# Patient Record
Sex: Female | Born: 1955 | Race: Black or African American | Hispanic: No | Marital: Married | Smoking: Never smoker
Health system: Southern US, Community
[De-identification: ages and names within clinical notes are randomized; demographics above are authoritative.]

## PROBLEM LIST (undated history)

## (undated) HISTORY — PX: BREAST REDUCTION SURGERY: SHX8

---

## 1999-04-15 ENCOUNTER — Emergency Department (HOSPITAL_COMMUNITY): Admission: EM | Admit: 1999-04-15 | Discharge: 1999-04-15 | Payer: Self-pay

## 1999-04-24 ENCOUNTER — Other Ambulatory Visit: Admission: RE | Admit: 1999-04-24 | Discharge: 1999-04-24 | Payer: Self-pay | Admitting: Obstetrics and Gynecology

## 2001-02-06 ENCOUNTER — Other Ambulatory Visit: Admission: RE | Admit: 2001-02-06 | Discharge: 2001-02-06 | Payer: Self-pay | Admitting: Obstetrics and Gynecology

## 2002-07-10 ENCOUNTER — Encounter: Admission: RE | Admit: 2002-07-10 | Discharge: 2002-07-10 | Payer: Self-pay | Admitting: Obstetrics and Gynecology

## 2002-07-10 ENCOUNTER — Encounter: Payer: Self-pay | Admitting: Obstetrics and Gynecology

## 2004-12-14 ENCOUNTER — Encounter: Admission: RE | Admit: 2004-12-14 | Discharge: 2004-12-14 | Payer: Self-pay | Admitting: Obstetrics and Gynecology

## 2004-12-25 ENCOUNTER — Encounter (INDEPENDENT_AMBULATORY_CARE_PROVIDER_SITE_OTHER): Payer: Self-pay | Admitting: *Deleted

## 2004-12-25 ENCOUNTER — Encounter: Admission: RE | Admit: 2004-12-25 | Discharge: 2004-12-25 | Payer: Self-pay | Admitting: Obstetrics and Gynecology

## 2005-03-01 ENCOUNTER — Ambulatory Visit: Payer: Self-pay | Admitting: Internal Medicine

## 2006-02-07 ENCOUNTER — Ambulatory Visit: Payer: Self-pay | Admitting: Internal Medicine

## 2007-01-09 DIAGNOSIS — J309 Allergic rhinitis, unspecified: Secondary | ICD-10-CM | POA: Insufficient documentation

## 2007-02-25 ENCOUNTER — Ambulatory Visit: Payer: Self-pay | Admitting: Internal Medicine

## 2007-02-25 DIAGNOSIS — R42 Dizziness and giddiness: Secondary | ICD-10-CM | POA: Insufficient documentation

## 2007-04-29 ENCOUNTER — Telehealth: Payer: Self-pay | Admitting: Internal Medicine

## 2010-05-15 ENCOUNTER — Ambulatory Visit
Admission: RE | Admit: 2010-05-15 | Discharge: 2010-05-15 | Payer: Self-pay | Source: Home / Self Care | Attending: Internal Medicine | Admitting: Internal Medicine

## 2010-05-15 ENCOUNTER — Other Ambulatory Visit: Payer: Self-pay | Admitting: Internal Medicine

## 2010-05-15 LAB — HEPATIC FUNCTION PANEL
ALT: 13 U/L (ref 0–35)
AST: 16 U/L (ref 0–37)
Albumin: 3.9 g/dL (ref 3.5–5.2)
Alkaline Phosphatase: 60 U/L (ref 39–117)
Bilirubin, Direct: 0.1 mg/dL (ref 0.0–0.3)
Total Bilirubin: 0.5 mg/dL (ref 0.3–1.2)
Total Protein: 7 g/dL (ref 6.0–8.3)

## 2010-05-15 LAB — CBC WITH DIFFERENTIAL/PLATELET
Basophils Absolute: 0 10*3/uL (ref 0.0–0.1)
Basophils Relative: 0.7 % (ref 0.0–3.0)
Eosinophils Absolute: 0.1 10*3/uL (ref 0.0–0.7)
Eosinophils Relative: 1.9 % (ref 0.0–5.0)
HCT: 35.4 % — ABNORMAL LOW (ref 36.0–46.0)
Hemoglobin: 11.2 g/dL — ABNORMAL LOW (ref 12.0–15.0)
Lymphocytes Relative: 39.8 % (ref 12.0–46.0)
Lymphs Abs: 2.4 10*3/uL (ref 0.7–4.0)
MCHC: 31.7 g/dL (ref 30.0–36.0)
MCV: 72.9 fl — ABNORMAL LOW (ref 78.0–100.0)
Monocytes Absolute: 0.3 10*3/uL (ref 0.1–1.0)
Monocytes Relative: 5.3 % (ref 3.0–12.0)
Neutro Abs: 3.2 10*3/uL (ref 1.4–7.7)
Neutrophils Relative %: 52.3 % (ref 43.0–77.0)
Platelets: 251 10*3/uL (ref 150.0–400.0)
RBC: 4.86 Mil/uL (ref 3.87–5.11)
RDW: 21.2 % — ABNORMAL HIGH (ref 11.5–14.6)
WBC: 6.1 10*3/uL (ref 4.5–10.5)

## 2010-05-15 LAB — BASIC METABOLIC PANEL
BUN: 15 mg/dL (ref 6–23)
CO2: 31 mEq/L (ref 19–32)
Calcium: 9.4 mg/dL (ref 8.4–10.5)
Chloride: 107 mEq/L (ref 96–112)
Creatinine, Ser: 0.7 mg/dL (ref 0.4–1.2)
GFR: 109.42 mL/min (ref >60.00)
Glucose, Bld: 78 mg/dL (ref 70–99)
Potassium: 4 mEq/L (ref 3.5–5.1)
Sodium: 143 mEq/L (ref 135–145)

## 2010-05-15 LAB — LIPID PANEL
Cholesterol: 160 mg/dL (ref 0–200)
HDL: 38.3 mg/dL — ABNORMAL LOW (ref >39.00)
LDL Cholesterol: 101 mg/dL — ABNORMAL HIGH (ref 0–99)
Total CHOL/HDL Ratio: 4
Triglycerides: 102 mg/dL (ref 0.0–149.0)
VLDL: 20.4 mg/dL (ref 0.0–40.0)

## 2010-05-15 LAB — TSH: TSH: 1.49 u[IU]/mL (ref 0.35–5.50)

## 2010-05-16 ENCOUNTER — Encounter: Payer: Self-pay | Admitting: Internal Medicine

## 2010-06-08 NOTE — Assessment & Plan Note (Signed)
Summary: new pt ov//ccm   Vital Signs:  Patient profile:   55 year old female Height:      63.5 inches Weight:      199 pounds BMI:     34.82 O2 Sat:      97 % on Room air Temp:     97.9 degrees F oral Pulse rate:   64 / minute BP sitting:   120 / 76  (right arm) Cuff size:   regular  Vitals Entered By: Duard Brady LPN (May 15, 2010 1:59 PM)  O2 Flow:  Room air ChiefCC: re- establish - doing well Is Patient Diabetic? No   CC:  re- establish - doing well.  History of Present Illness: 55 -year-old patient who is in today to establish with our practice.  she has enjoyed excellent health and is followed by gynecology.  She states she has a long history of anemia since childhood.  She also has a history of childhood asthma that is no longer an issue.  She is followed by gynecology and does have a history of uterine fibroids.  Her last menstrual cycle was approximately 5 months ago  Preventive Screening-Counseling & Management  Alcohol-Tobacco     Smoking Status: never  Allergies: 1)  ! Penicillin V Potassium (Penicillin V Potassium)  Past History:  Past Medical History: Allergic rhinitis history of anemia uterine fibroids  Past Surgical History: status post breast reduction surgery in 1994 no prior colonoscopy  Family History: Reviewed history and no changes required. 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    Physical Exam  General:  overweight-appearing.  124/80overweight-appearing.   Head:  Normocephalic and atraumatic without obvious abnormalities. No apparent alopecia or balding. Eyes:  No corneal or conjunctival inflammation noted. EOMI. Perrla. Funduscopic exam benign, without hemorrhages, exudates or papilledema. Vision grossly normal. Ears:  External ear exam shows no significant lesions or deformities.  Otoscopic examination reveals clear canals, tympanic membranes are intact bilaterally without bulging, retraction, inflammation or discharge. Hearing is grossly normal bilaterally. Nose:  External nasal examination shows no deformity or inflammation. Nasal mucosa are pink and moist without lesions or exudates. Mouth:  Oral mucosa and oropharynx without lesions or exudates.  Teeth in good repair. Neck:  No deformities, masses, or tenderness noted. Chest Wall:  No deformities, masses, or tenderness noted. Lungs:  Normal respiratory effort, chest expands symmetrically. Lungs are clear to auscultation, no crackles or wheezes. Heart:  Normal rate and regular rhythm. S1 and S2 normal without gallop, murmur, click, rub or other extra sounds. Abdomen:  Bowel sounds positive,abdomen soft and non-tender, no organomegaly or hernias noted. pelvic mass noted, more prominent on the right side Msk:  No deformity or scoliosis noted of thoracic or lumbar spine.   Pulses:  R and L carotid,radial,femoral,dorsalis pedis and posterior tibial pulses are full and equal bilaterally Extremities:  No clubbing, cyanosis, edema, or deformity noted with normal full range of motion of all joints.  Neurologic:  No cranial nerve deficits noted. Station and gait are normal. Plantar reflexes are down-going bilaterally. DTRs are symmetrical throughout. Sensory, motor and  coordinative functions appear intact. Skin:  Intact without suspicious lesions or rashes Cervical Nodes:  No lymphadenopathy noted Axillary Nodes:  No palpable lymphadenopathy Inguinal Nodes:  No significant adenopathy Psych:  Cognition and judgment appear intact. Alert and cooperative with normal attention span and concentration. No apparent delusions, illusions, hallucinations   Impression & Recommendations:  Problem # 1:  Preventive Health Care (ICD-V70.0)  Orders: Venipuncture (16109) TLB-Lipid Panel (80061-LIPID) TLB-BMP (Basic Metabolic Panel-BMET) (80048-METABOL) TLB-CBC Platelet - w/Differential (85025-CBCD) TLB-Hepatic/Liver Function Pnl (80076-HEPATIC) TLB-TSH (Thyroid Stimulating Hormone) (60454-UJW) Gastroenterology Referral (GI) Specimen Handling (11914)  Complete Medication List: 1)  Claritin 10 Mg Caps (Loratadine) .... Prn  Patient Instructions: 1)  Please schedule a follow-up appointment in 1 year. 2)  Limit your Sodium (Salt). 3)  It is important that you exercise regularly at least 20 minutes 5 times a week. If you develop chest pain, have severe difficulty breathing, or feel very tired , stop exercising immediately and seek medical attention. 4)  You need to lose weight. Consider a lower calorie diet and regular exercise.  5)  Schedule a colonoscopy/sigmoidoscopy to help detect colon cancer. 6)  Take calcium +Vitamin D daily. Who  Orders Added: 1)  New Patient 57-64 years [99386] 2)  Venipuncture [36415] 3)  TLB-Lipid Panel [80061-LIPID] 4)  TLB-BMP (Basic Metabolic Panel-BMET) [80048-METABOL] 5)  TLB-CBC Platelet - w/Differential [85025-CBCD] 6)  TLB-Hepatic/Liver Function Pnl [80076-HEPATIC] 7)  TLB-TSH (Thyroid Stimulating Hormone) [84443-TSH] 8)  Gastroenterology Referral [GI] 9)  Specimen Handling [99000]

## 2010-06-08 NOTE — Letter (Signed)
Summary: PATIENT HX FORM  PATIENT HX FORM   Imported By: Georgian Co 05/16/2010 09:10:38  _____________________________________________________________________  External Attachment:    Type:   Image     Comment:   External Document

## 2013-11-04 ENCOUNTER — Encounter (HOSPITAL_COMMUNITY): Payer: Self-pay | Admitting: Emergency Medicine

## 2013-11-04 ENCOUNTER — Emergency Department (HOSPITAL_COMMUNITY)
Admission: EM | Admit: 2013-11-04 | Discharge: 2013-11-04 | Disposition: A | Discharge status: Home / Self Care | Payer: BC Managed Care – PPO | Attending: Emergency Medicine | Admitting: Emergency Medicine

## 2013-11-04 DIAGNOSIS — M545 Low back pain, unspecified: Secondary | ICD-10-CM | POA: Insufficient documentation

## 2013-11-04 DIAGNOSIS — Z880 Allergy status to penicillin: Secondary | ICD-10-CM | POA: Insufficient documentation

## 2013-11-04 MED ORDER — OXYCODONE HCL 5 MG PO TABS: 2.5000 mg | ORAL_TABLET | ORAL | Status: DC | PRN

## 2013-11-04 MED ORDER — OXYCODONE HCL 5 MG PO TABS
5.0000 mg | ORAL_TABLET | Freq: Once | ORAL | Status: AC
  Administered 2013-11-04: 5 mg via ORAL
  Filled 2013-11-04: qty 1

## 2013-11-04 NOTE — ED Provider Notes (Signed)
CSN: [id number]     Arrival date & time 11/04/13  1221 History  This chart was scribed for non-physician practitioner, Mellody DrownLauren Josilyn Shippee, PA-C working with Flint MelterElliott L Wentz, MD by Greggory StallionKayla Andersen, ED scribe. This patient was seen in room TR10C/TR10C and the patient's care was started at 1:59 PM.   Chief Complaint  Patient presents with  . Back Pain   HPI Comments: Kristin HoylesKaren Bonita Richardson is a 58 y.o. female who presents Kristin the Emergency Department complaining of intermittent lower back pain that radiates into her left hip that started 3 weeks ago. States the most recent episode started one week ago and states it is worse than before. Movement worsens the pain. Denies radiation. Pt has not taken anything for her pain. No lower extremity discomfort. Denies fever, chills, bowel or bladder incontinence, dysuria, hematuria, urinary frequency, urgency, numbness or tingling in lower extremities. Denies history of diabetes.  PCP: Rogelia BogaKWIATKOWSKI,PETER FRANK, MD  The history is provided by the patient. No language interpreter was used.    History reviewed. No pertinent past medical history. History reviewed. No pertinent past surgical history. History reviewed. No pertinent family history. History  Substance Use Topics  . Smoking status: Never Smoker   . Smokeless tobacco: Not on file  . Alcohol Use: No   OB History   Grav Para Term Preterm Abortions TAB SAB Ect Mult Living                 Review of Systems  Constitutional: Negative for fever and chills.  Gastrointestinal: Negative for abdominal pain.  Genitourinary: Negative for dysuria, urgency, frequency and hematuria.       Negative for bowel or bladder incontinence.  Musculoskeletal: Positive for back pain.  Skin: Negative for color change and wound.  Neurological: Negative for weakness and numbness.  All other systems reviewed and are negative.  Allergies  Aspirin; Ibuprofen; Penicillins; and Tylenol  Home Medications   Prior Kristin Admission  medications   Not on File   BP 145/75  Pulse 82  Temp(Src) 98.3 F (36.8 C) (Oral)  Resp 20  Ht 5\' 3"  (1.6 m)  Wt 190 lb (86.183 kg)  BMI 33.67 kg/m2  SpO2 99%  Physical Exam  Nursing note and vitals reviewed. Constitutional: She is oriented Kristin person, place, and time. She appears well-developed and well-nourished.  Non-toxic appearance. She does not have a sickly appearance. She does not appear ill. No distress.  HENT:  Head: Normocephalic and atraumatic.  Eyes: Conjunctivae and EOM are normal.  Neck: Neck supple.  Cardiovascular: Normal rate.   Pulmonary/Chest: Effort normal. No respiratory distress.  Musculoskeletal: Normal range of motion.  No midline C-spine, T-spine, or L-spine tenderness with no step-offs, crepitus, or deformities noted. Comfort reproducible with palpation of left SI joint. Good and equal strength, and sensation Kristin the lower extremities.  Neurological: She is alert and oriented Kristin person, place, and time.  Strength and sensation intact in bilateral lower extremities.  Skin: Skin is warm and dry. She is not diaphoretic.  Psychiatric: She has a normal mood and affect. Her behavior is normal.    ED Course  Procedures (including critical care time)  COORDINATION OF CARE: 2:07 PM-Discussed treatment plan which includes pain medication with pt at bedside and pt agreed Kristin plan.   Labs Review Labs Reviewed - No data Kristin display  Imaging Review No results found.   EKG Interpretation None      MDM   Final diagnoses:  Left-sided low back pain  without sciatica   Patient presents with left lower back pain, no neurologic findings on exam, out tenderness Kristin left SI joint. Patient reports an allergy Kristin multiple medications, will treat with oxycodone without Tylenol. No concern for cauda equina.  No fever, night sweats, weight loss, h/o cancer, IVDU.  RICE protocol and pain medicine indicated and discussed with patient. Discussed treatment plan with the  patient. Return precautions given. Reports understanding and no other concerns at this time.  Patient is stable for discharge at this time.  Meds given in ED:  Medications  oxyCODONE (Oxy IR/ROXICODONE) immediate release tablet 5 mg (not administered)    New Prescriptions   OXYCODONE (ROXICODONE) 5 MG IMMEDIATE RELEASE TABLET    Take 0.5 tablets (2.5 mg total) by mouth every 4 (four) hours as needed for severe pain.    I personally performed the services described in this documentation, which was scribed in my presence. The recorded information has been reviewed and is accurate.  Clabe SealLauren M Ellarose Brandi, PA-C 11/04/13 [phone]

## 2013-11-04 NOTE — Discharge Instructions (Signed)
Call for a follow up appointment with a Family or Primary Care Provider.  Return if Symptoms worsen.   Take medication as prescribed.  Ice your back 3-4 times a day. Use your pain medication as prescribed and do not operate heavy machinery while on pain medication. Note that your pain medication contains acetaminophen (Tylenol) & its is not recommended that you use additional acetaminophen (Tylenol) while taking this medication.

## 2013-11-04 NOTE — ED Notes (Signed)
Pt c/o left sided lower back into hip pain x 3 weeks worse over last week; pt denies obvious injury

## 2013-11-04 NOTE — ED Provider Notes (Signed)
Medical screening examination/treatment/procedure(s) were performed by non-physician practitioner and as supervising physician I was immediately available for consultation/collaboration.  Jamiria Langill L Josemanuel Eakins, MD 11/04/13 1557 

## 2014-12-24 ENCOUNTER — Encounter (HOSPITAL_COMMUNITY): Payer: Self-pay | Admitting: *Deleted

## 2014-12-24 ENCOUNTER — Emergency Department (HOSPITAL_COMMUNITY)
Admission: EM | Admit: 2014-12-24 | Discharge: 2014-12-24 | Disposition: A | Discharge status: Home / Self Care | Payer: BLUE CROSS/BLUE SHIELD | Attending: Emergency Medicine | Admitting: Emergency Medicine

## 2014-12-24 ENCOUNTER — Emergency Department (HOSPITAL_COMMUNITY): Payer: BLUE CROSS/BLUE SHIELD

## 2014-12-24 DIAGNOSIS — M545 Low back pain, unspecified: Secondary | ICD-10-CM

## 2014-12-24 DIAGNOSIS — S3992XA Unspecified injury of lower back, initial encounter: Secondary | ICD-10-CM | POA: Diagnosis not present

## 2014-12-24 DIAGNOSIS — Y9301 Activity, walking, marching and hiking: Secondary | ICD-10-CM | POA: Insufficient documentation

## 2014-12-24 DIAGNOSIS — W19XXXA Unspecified fall, initial encounter: Secondary | ICD-10-CM

## 2014-12-24 DIAGNOSIS — S8991XA Unspecified injury of right lower leg, initial encounter: Secondary | ICD-10-CM | POA: Diagnosis present

## 2014-12-24 DIAGNOSIS — Y998 Other external cause status: Secondary | ICD-10-CM | POA: Insufficient documentation

## 2014-12-24 DIAGNOSIS — Y92511 Restaurant or cafe as the place of occurrence of the external cause: Secondary | ICD-10-CM | POA: Diagnosis not present

## 2014-12-24 DIAGNOSIS — Z880 Allergy status to penicillin: Secondary | ICD-10-CM | POA: Diagnosis not present

## 2014-12-24 DIAGNOSIS — S8011XA Contusion of right lower leg, initial encounter: Secondary | ICD-10-CM | POA: Insufficient documentation

## 2014-12-24 DIAGNOSIS — W108XXA Fall (on) (from) other stairs and steps, initial encounter: Secondary | ICD-10-CM | POA: Insufficient documentation

## 2014-12-24 DIAGNOSIS — M25561 Pain in right knee: Secondary | ICD-10-CM

## 2014-12-24 MED ORDER — OXYCODONE HCL 5 MG PO TABS: 2.5000 mg | ORAL_TABLET | ORAL | Status: DC | PRN

## 2014-12-24 MED ORDER — OXYCODONE HCL 5 MG PO TABS
2.5000 mg | ORAL_TABLET | Freq: Once | ORAL | Status: AC
  Administered 2014-12-24: 2.5 mg via ORAL

## 2014-12-24 MED ORDER — OXYCODONE HCL 5 MG PO TABS
5.0000 mg | ORAL_TABLET | Freq: Once | ORAL | Status: DC
  Filled 2014-12-24: qty 1

## 2014-12-24 MED ORDER — METHOCARBAMOL 500 MG PO TABS: 500.0000 mg | ORAL_TABLET | Freq: Two times a day (BID) | ORAL | Status: DC | PRN

## 2014-12-24 MED ORDER — CYCLOBENZAPRINE HCL 10 MG PO TABS
10.0000 mg | ORAL_TABLET | Freq: Once | ORAL | Status: AC
Start: 2014-12-24 — End: 2014-12-24
  Administered 2014-12-24: 10 mg via ORAL
  Filled 2014-12-24: qty 1

## 2014-12-24 NOTE — ED Notes (Signed)
Pt presents via POV c/o right knee pain and lower back pain after she fell in the bathroom at [employer] when there was grease on the floor.  Pt denies hitting her head, reports knee swelling and pain after, knee brace on when pt arrived, pt able to walk on it but with pain, no deformity noted, swelling noted in leg and ankle.  Pt a x 4, NAD.

## 2014-12-24 NOTE — Discharge Instructions (Signed)
Knee Pain °The knee is the complex joint between your thigh and your lower leg. It is made up of bones, tendons, ligaments, and cartilage. The bones that make up the knee are: °· The femur in the thigh. °· The tibia and fibula in the lower leg. °· The patella or kneecap riding in the groove on the lower femur. °CAUSES  °Knee pain is a common complaint with many causes. A few of these causes are: °· Injury, such as: °· A ruptured ligament or tendon injury. °· Torn cartilage. °· Medical conditions, such as: °· Gout °· Arthritis °· Infections °· Overuse, over training, or overdoing a physical activity. °Knee pain can be minor or severe. Knee pain can accompany debilitating injury. Minor knee problems often respond well to self-care measures or get well on their own. More serious injuries may need medical intervention or even surgery. °SYMPTOMS °The knee is complex. Symptoms of knee problems can vary widely. Some of the problems are: °· Pain with movement and weight bearing. °· Swelling and tenderness. °· Buckling of the knee. °· Inability to straighten or extend your knee. °· Your knee locks and you cannot straighten it. °· Warmth and redness with pain and fever. °· Deformity or dislocation of the kneecap. °DIAGNOSIS  °Determining what is wrong may be very straight forward such as when there is an injury. It can also be challenging because of the complexity of the knee. Tests to make a diagnosis may include: °· Your caregiver taking a history and doing a physical exam. °· Routine X-rays can be used to rule out other problems. X-rays will not reveal a cartilage tear. Some injuries of the knee can be diagnosed by: °¨ Arthroscopy a surgical technique by which a small video camera is inserted through tiny incisions on the sides of the knee. This procedure is used to examine and repair internal knee joint problems. Tiny instruments can be used during arthroscopy to repair the torn knee cartilage (meniscus). °¨ Arthrography  is a radiology technique. A contrast liquid is directly injected into the knee joint. Internal structures of the knee joint then become visible on X-ray film. °¨ An MRI scan is a non X-ray radiology procedure in which magnetic fields and a computer produce two- or three-dimensional images of the inside of the knee. Cartilage tears are often visible using an MRI scanner. MRI scans have largely replaced arthrography in diagnosing cartilage tears of the knee. °· Blood work. °· Examination of the fluid that helps to lubricate the knee joint (synovial fluid). This is done by taking a sample out using a needle and a syringe. °TREATMENT °The treatment of knee problems depends on the cause. Some of these treatments are: °· Depending on the injury, proper casting, splinting, surgery, or physical therapy care will be needed. °· Give yourself adequate recovery time. Do not overuse your joints. If you begin to get sore during workout routines, back off. Slow down or do fewer repetitions. °· For repetitive activities such as cycling or running, maintain your strength and nutrition. °· Alternate muscle groups. For example, if you are a weight lifter, work the upper body on one day and the lower body the next. °· Either tight or weak muscles do not give the proper support for your knee. Tight or weak muscles do not absorb the stress placed on the knee joint. Keep the muscles surrounding the knee strong. °· Take care of mechanical problems. °¨ If you have flat feet, orthotics or special shoes may help.   See your caregiver if you need help. °¨ Arch supports, sometimes with wedges on the inner or outer aspect of the heel, can help. These can shift pressure away from the side of the knee most bothered by osteoarthritis. °¨ A brace called an "unloader" brace also may be used to help ease the pressure on the most arthritic side of the knee. °· If your caregiver has prescribed crutches, braces, wraps or ice, use as directed. The acronym  for this is PRICE. This means protection, rest, ice, compression, and elevation. °· Nonsteroidal anti-inflammatory drugs (NSAIDs), can help relieve pain. But if taken immediately after an injury, they may actually increase swelling. Take NSAIDs with food in your stomach. Stop them if you develop stomach problems. Do not take these if you have a history of ulcers, stomach pain, or bleeding from the bowel. Do not take without your caregiver's approval if you have problems with fluid retention, heart failure, or kidney problems. °· For ongoing knee problems, physical therapy may be helpful. °· Glucosamine and chondroitin are over-the-counter dietary supplements. Both may help relieve the pain of osteoarthritis in the knee. These medicines are different from the usual anti-inflammatory drugs. Glucosamine may decrease the rate of cartilage destruction. °· Injections of a corticosteroid drug into your knee joint may help reduce the symptoms of an arthritis flare-up. They may provide pain relief that lasts a few months. You may have to wait a few months between injections. The injections do have a small increased risk of infection, water retention, and elevated blood sugar levels. °· Hyaluronic acid injected into damaged joints may ease pain and provide lubrication. These injections may work by reducing inflammation. A series of shots may give relief for as long as 6 months. °· Topical painkillers. Applying certain ointments to your skin may help relieve the pain and stiffness of osteoarthritis. Ask your pharmacist for suggestions. Many over the-counter products are approved for temporary relief of arthritis pain. °· In some countries, doctors often prescribe topical NSAIDs for relief of chronic conditions such as arthritis and tendinitis. A review of treatment with NSAID creams found that they worked as well as oral medications but without the serious side effects. °PREVENTION °· Maintain a healthy weight. Extra pounds  put more strain on your joints. °· Get strong, stay limber. Weak muscles are a common cause of knee injuries. Stretching is important. Include flexibility exercises in your workouts. °· Be smart about exercise. If you have osteoarthritis, chronic knee pain or recurring injuries, you may need to change the way you exercise. This does not mean you have to stop being active. If your knees ache after jogging or playing basketball, consider switching to swimming, water aerobics, or other low-impact activities, at least for a few days a week. Sometimes limiting high-impact activities will provide relief. °· Make sure your shoes fit well. Choose footwear that is right for your sport. °· Protect your knees. Use the proper gear for knee-sensitive activities. Use kneepads when playing volleyball or laying carpet. Buckle your seat belt every time you drive. Most shattered kneecaps occur in car accidents. °· Rest when you are tired. °SEEK MEDICAL CARE IF:  °You have knee pain that is continual and does not seem to be getting better.  °SEEK IMMEDIATE MEDICAL CARE IF:  °Your knee joint feels hot to the touch and you have a high fever. °MAKE SURE YOU:  °· Understand these instructions. °· Will watch your condition. °· Will get help right away if you are not   doing well or get worse. Document Released: 02/18/2007 Document Revised: 07/16/2011 Document Reviewed: 02/18/2007 [facility] Patient Information 2015 [place], [place]. This information is not intended to replace advice  Musculoskeletal Pain Musculoskeletal pain is muscle and boney aches and pains. These pains can occur in any part of the body. Your caregiver may treat you without knowing the cause of the pain. They may treat you if blood or urine tests, X-rays, and other tests were normal.  CAUSES There is often not a definite cause or reason for these pains. These pains may be caused by a type of germ (virus). The discomfort may also come from overuse. Overuse includes  working out too hard when your body is not fit. Boney aches also come from weather changes. Bone is sensitive to atmospheric pressure changes. HOME CARE INSTRUCTIONS   Ask when your test results will be ready. Make sure you get your test results.  Only take over-the-counter or prescription medicines for pain, discomfort, or fever as directed by your caregiver. If you were given medications for your condition, do not drive, operate machinery or power tools, or sign legal documents for 24 hours. Do not drink alcohol. Do not take sleeping pills or other medications that may interfere with treatment.  Continue all activities unless the activities cause more pain. When the pain lessens, slowly resume normal activities. Gradually increase the intensity and duration of the activities or exercise.  During periods of severe pain, bed rest may be helpful. Lay or sit in any position that is comfortable.  Putting ice on the injured area.  Put ice in a bag.  Place a towel between your skin and the bag.  Leave the ice on for 15 to 20 minutes, 3 to 4 times a day.  Follow up with your caregiver for continued problems and no reason can be found for the pain. If the pain becomes worse or does not go away, it may be necessary to repeat tests or do additional testing. Your caregiver may need to look further for a possible cause. SEEK IMMEDIATE MEDICAL CARE IF:  You have pain that is getting worse and is not relieved by medications.  You develop chest pain that is associated with shortness or breath, sweating, feeling sick to your stomach (nauseous), or throw up (vomit).  Your pain becomes localized to the abdomen.  You develop any new symptoms that seem different or that concern you. MAKE SURE YOU:   Understand these instructions.  Will watch your condition.  Will get help right away if you are not doing well or get worse. Document Released: 04/23/2005 Document Revised: 07/16/2011 Document Reviewed:  12/26/2012 [facility] Patient Information 2015 [place], [place]. This information is not intended to replace advice given to you by your health care provider. Make sure you discuss any questions you have with your health care provider. given to you by your health care provider. Make sure you discuss any questions you have with your health care provider.

## 2014-12-24 NOTE — ED Notes (Signed)
Patient transported to X-ray 

## 2014-12-24 NOTE — ED Provider Notes (Signed)
CSN: [id number]     Arrival date & time 12/24/14  1102 History   First MD Initiated Contact with Patient 12/24/14 1124     Chief Complaint  Patient presents with  . Knee Pain     (Consider location/radiation/quality/duration/timing/severity/associated sxs/prior Treatment) HPI   Patient is a 59 year old female, otherwise healthy, who presents to the ER after a mechanical fall that occurred last night approximately midnight.  She was walking in a restaurant down the last step of a small amount of stairs and states that she slipped on grease and fell down although she cannot describe how it exactly happened because it happened so fast. She denies striking her back or head and further denies any loss of consciousness.  It took her a few minutes to get up because she was just surprised and drinking some pain in her right knee. She was able to weight-bear with some pain and she states that she felt that her for right knee was rotated inward.  She has some bruising on her right shin and states she had some deep achy pain in her knee which is worse on the medial aspect and behind her knee.  Her low back and began to ache, but she has no radiation, no numbness, tingling, loss of bowel or bladder control.  History reviewed. No pertinent past medical history. Past Surgical History  Procedure Laterality Date  . Breast reduction surgery     No family history on file. Social History  Substance Use Topics  . Smoking status: Never Smoker   . Smokeless tobacco: None  . Alcohol Use: No   OB History    No data available     Review of Systems  Constitutional: Negative.   HENT: Negative.   Respiratory: Negative.   Cardiovascular: Negative.   Gastrointestinal: Negative.   Endocrine: Negative.   Genitourinary: Negative.   Musculoskeletal: Negative for myalgias, joint swelling, neck pain and neck stiffness.  Skin: Negative for color change, pallor and wound.  Neurological: Negative for dizziness,  tremors, syncope, weakness, light-headedness and numbness.    Allergies  Aspirin; Ibuprofen; Penicillins; and Tylenol  Home Medications   Prior to Admission medications   Medication Sig Start Date End Date Taking? Authorizing Provider  methocarbamol (ROBAXIN) 500 MG tablet Take 1 tablet (500 mg total) by mouth 2 (two) times daily as needed for muscle spasms. 12/24/14   Danelle Berry, PA-C  oxyCODONE (ROXICODONE) 5 MG immediate release tablet Take 0.5 tablets (2.5 mg total) by mouth every 4 (four) hours as needed for moderate pain or severe pain. 12/24/14   Danelle Berry, PA-C   BP 130/72 mmHg  Pulse 77  Temp(Src) 98.3 F (36.8 C) (Oral)  Resp 16  Ht  (1.575 m)  SpO2 99%  LMP 09/23/2014 Physical Exam  Constitutional: She is oriented to person, place, and time. She appears well-developed and well-nourished. No distress.  HENT:  Head: Normocephalic and atraumatic.  Right Ear: External ear normal.  Left Ear: External ear normal.  Nose: Nose normal.  Mouth/Throat: Oropharynx is clear and moist. No oropharyngeal exudate.  Eyes: Conjunctivae and EOM are normal. Pupils are equal, round, and reactive to light. Right eye exhibits no discharge. Left eye exhibits no discharge. No scleral icterus.  Neck: Normal range of motion. Neck supple. No JVD present. No tracheal deviation present.  Cardiovascular: Normal rate, regular rhythm, normal heart sounds and intact distal pulses.  Exam reveals no gallop and no friction rub.   No murmur heard. Pulmonary/Chest: Effort  normal and breath sounds normal. No stridor. No respiratory distress. She has no wheezes. She has no rales. She exhibits no tenderness.  Abdominal: Soft. Bowel sounds are normal. She exhibits no distension.  Musculoskeletal: Normal range of motion. She exhibits tenderness. She exhibits no edema.  Mild swelling and bruising to her right shin, normal range of motion of right knee and right hip. Tenderness with valgus stress and palpation  to the medial joint line. No effusion palpated, no erythema, no edema, no abrasion.  Negative anterior drawer test.   Generalized Tender to palpation lumbar spine and paraspinal, no SI tenderness, no contusion, edema or erythema anywhere on back. Normal sensation to light touch bilateral lower extremities   Lymphadenopathy:    She has no cervical adenopathy.  Neurological: She is alert and oriented to person, place, and time. She exhibits normal muscle tone. Coordination normal.  Skin: Skin is warm and dry. No rash noted. She is not diaphoretic. No erythema. No pallor.  Psychiatric: She has a normal mood and affect. Her behavior is normal. Judgment and thought content normal.    ED Course  Procedures (including critical care time) Labs Review Labs Reviewed - No data to display  Imaging Review Dg Knee Complete 4 Views Right  12/24/2014   CLINICAL DATA:  Fall down steps last night. Medial knee pain and swelling. Initial encounter.  EXAM: RIGHT KNEE - COMPLETE 4+ VIEW  COMPARISON:  None.  FINDINGS: There is no evidence of fracture, dislocation, or joint effusion. Enthesopathic changes are seen involving the patella. No evidence of joint space narrowing or other signs of arthropathy. No other significant bone abnormality identified.  IMPRESSION: No acute findings.   Electronically Signed   By: Myles Rosenthal M.D.   On: 12/24/2014 12:37   I have personally reviewed and evaluated these images and lab results as part of my medical decision-making.   EKG Interpretation None      MDM   Final diagnoses:  Knee pain, acute, right  Midline low back pain without sciatica  Fall, initial encounter    Patient with right knee pain after a slip and fall last night, has some "soreness" in her low back, did not strike her back or her head did not lose consciousness Patient was given pain medicine and Flexeril in the ER, x-ray of the right knee was obtained, negative for any acute  pathology/fracture  Her back pain is generalized, without any neuro deficit, no indication for imaging, normal expected muscle soreness after fall.  RICE protocol and pain medicine indicated and discussed with patient.    Patient was encouraged to follow up with her PCP, normal muscle soreness and expectations were described to the patient. I suggested Rice therapy, patient states she cannot take aspirin, ibuprofen, Aleve, Tylenol, but she has iced and applied a knee wrap before coming to the ER  Ortho follow-up will be provided if needed All findings have been reviewed with the pt.  She states she does not need a work note. Pt discharged in good condition with sleeve and crutches.  Danelle Berry, PA-C 12/24/14 1313  Blane Ohara, MD 12/25/14 [phone]

## 2014-12-24 NOTE — ED Notes (Signed)
PA at bedside.

## 2015-01-19 ENCOUNTER — Other Ambulatory Visit: Payer: Self-pay | Admitting: Orthopedic Surgery

## 2015-01-19 DIAGNOSIS — M25561 Pain in right knee: Secondary | ICD-10-CM

## 2015-01-26 ENCOUNTER — Other Ambulatory Visit: Payer: Self-pay | Admitting: Orthopedic Surgery

## 2015-01-26 DIAGNOSIS — Z139 Encounter for screening, unspecified: Secondary | ICD-10-CM

## 2015-01-27 ENCOUNTER — Other Ambulatory Visit: Payer: Self-pay

## 2015-02-14 ENCOUNTER — Ambulatory Visit
Admission: RE | Admit: 2015-02-14 | Discharge: 2015-02-14 | Disposition: A | Discharge status: Home / Self Care | Payer: BLUE CROSS/BLUE SHIELD | Source: Ambulatory Visit | Attending: Orthopedic Surgery | Admitting: Orthopedic Surgery

## 2015-02-14 DIAGNOSIS — M25561 Pain in right knee: Secondary | ICD-10-CM

## 2015-02-14 DIAGNOSIS — Z139 Encounter for screening, unspecified: Secondary | ICD-10-CM

## 2015-06-13 ENCOUNTER — Ambulatory Visit: Payer: BLUE CROSS/BLUE SHIELD | Attending: Orthopaedic Surgery

## 2015-06-13 DIAGNOSIS — M6289 Other specified disorders of muscle: Secondary | ICD-10-CM | POA: Insufficient documentation

## 2015-06-13 DIAGNOSIS — R29898 Other symptoms and signs involving the musculoskeletal system: Secondary | ICD-10-CM

## 2015-06-13 DIAGNOSIS — R293 Abnormal posture: Secondary | ICD-10-CM

## 2015-06-13 DIAGNOSIS — M542 Cervicalgia: Secondary | ICD-10-CM | POA: Diagnosis present

## 2015-06-13 DIAGNOSIS — M5010 Cervical disc disorder with radiculopathy, unspecified cervical region: Secondary | ICD-10-CM | POA: Insufficient documentation

## 2015-06-13 NOTE — Patient Instructions (Signed)
Posture - Standing   Good posture is important. Avoid slouching and forward head thrust. Maintain curve in low back and align ears over shoulders, hips over ankles.  Pull your belly button in toward your back bone. Posture Tips DO: - stand tall and erect - keep chin tucked in - keep head and shoulders in alignment - check posture regularly in mirror or large window - pull head back against headrest in car seat;  Change your position often.  Sit with lumbar support. DON'T: - slouch or slump while watching TV or reading - sit, stand or lie in one position  for too long;  Sitting is especially hard on the spine so if you sit at a desk/use the computer, then stand up often! Copyright  VHI. All rights reserved.  Posture - Sitting  Sit upright, head facing forward. Try using a roll to support lower back. Keep shoulders relaxed, and avoid rounded back. Keep hips level with knees. Avoid crossing legs for long periods. Copyright  VHI. All rights reserved.  Chronic neck strain can develop because of poor posture and faulty work habits  Postural strain related to slumped sitting and forward head posture is a leading cause of headaches, neck and upper back pain  General strengthening and flexibility exercises are helpful in the treatment of neck pain.  Most importantly, you should learn to correct the posture that may be contributing to chronic pain.   Change positions frequently  Change your work or home environment to improve posture and mechanics.   PERFORM ALL EXERCISES GENTLY AND WITH GOOD POSTURE.    20 SECOND HOLD, 3 REPS TO EACH SIDE. 4-5 TIMES EACH DAY.   AROM: Neck Rotation   Turn head slowly to look over one shoulder, then the other.   AROM: Neck Flexion   Bend head forward.   AROM: Lateral Neck Flexion   Slowly tilt head toward one shoulder, then the other.   [facility] [address], [address] [place], [place] [address] Phone # [phone] Fax  [phone] 

## 2015-06-13 NOTE — Therapy (Signed)
[facility] [address], [address] [place], [place], [address] Phone: [phone]   Fax:  [phone]  Physical Therapy Evaluation  Patient Details  Name: Kristin Richardson MRN: [id number] Date of Birth: 1955-12-16 Referring Provider: Annell Greening, MD  Encounter Date: 06/13/2015      PT End of Session - 06/13/15 1052    Visit Number 1   Date for PT Re-Evaluation 08/08/15   PT Start Time 1023   PT Stop Time 1055   PT Time Calculation (min) 32 min   Activity Tolerance Patient tolerated treatment well   Behavior During Therapy [facility] for tasks assessed/performed      History reviewed. No pertinent past medical history.  Past Surgical History  Procedure Laterality Date  . Breast reduction surgery      There were no vitals filed for this visit.  Visit Diagnosis:  Neck pain - Plan: PT plan of care cert/re-cert  Posture abnormality - Plan: PT plan of care cert/re-cert  Hand weakness - Plan: PT plan of care cert/re-cert  Cervical disc disorder with radiculopathy of cervical region - Plan: PT plan of care cert/re-cert      Subjective Assessment - 06/13/15 1025    Subjective Pt reports to PT with compliants of neck pain and bil. finger/hand pain/numbness that began when she fell on a wet floor 12/2014.  Pt reports that she began to notice tingling in her fingers and hands (bilateral).  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     Pertinent History fall on wet floor (12/2014)   Diagnostic tests x-ray: cervical spondylosis   Patient Stated Goals reduce pain in neck, reduced UE tingling/symptoms   Currently in Pain? Yes   Pain Score 5    Pain Location Hand  5/10 in fingers and 7/10 in Rt shoulder   Pain Orientation Right;Left   Pain Descriptors / Indicators Pins and needles;Pressure   Pain Type  Neuropathic pain   Pain Radiating Towards neck, shoulders   Pain Onset More than a month ago   Pain Frequency Constant   Aggravating Factors  housework (dishes), combing hair   Pain Relieving Factors moving arms (arm circles), stretching            [facility] PT Assessment - 06/13/15 0001    Assessment   Medical Diagnosis cervical spondylosis   Referring Provider Annell Greening, MD   Onset Date/Surgical Date 12/23/14   Hand Dominance Right   Next MD Visit end of February   Precautions   Precautions None   Restrictions   Weight Bearing Restrictions No   Balance Screen   Has the patient fallen in the past 6 months Yes   How many times? 1  wet floor   Has the patient had a decrease in activity level because of a fear of falling?  No   Is the patient reluctant to leave their home because of a fear of falling?  No   Home Environment   Living Environment Private residence   Living Arrangements Spouse/significant other   Type of Home House   Cognition   Overall Cognitive Status Within Functional Limits for tasks assessed   Observation/Other Assessments   Focus on Therapeutic Outcomes (FOTO)  67% limitation   Posture/Postural Control   Posture/Postural Control Postural limitations   Postural Limitations Rounded Shoulders;Forward  head   ROM / Strength   AROM / PROM / Strength AROM;PROM;Strength   AROM   Overall AROM  Within functional limits for tasks performed   Overall AROM Comments full cervical and UE AROM with stiffness reported at end range.  Lt shoulder pain at end range of Lt shoulder AROM.   PROM   Overall PROM  Within functional limits for tasks performed   Overall PROM Comments full cervical PROM without pain   Strength   Overall Strength Deficits   Overall Strength Comments 4 to 4+/5 bil shoulder and elbow strength   Strength Assessment Site Hand   Right/Left hand Right;Left   Right Hand Grip (lbs) 39   Left Hand Grip (lbs) 47   Palpation   Spinal mobility cervcial  PA mobility limited by 25% without pain   Palpation comment palpable tenderness at bil. suboccipitals, trigger points in Rt UT                           PT Education - 06/13/15 1051    Education provided Yes   Education Details HEP: posture, cervical AROM   Person(s) Educated Patient   Methods Explanation;Demonstration;Handout   Comprehension Verbalized understanding;Returned demonstration          PT Short Term Goals - 06/13/15 1101    PT SHORT TERM GOAL #1   Title be independent in initial HEP   Time 4   Period Weeks   Status New   PT SHORT TERM GOAL #2   Title report a 30% reduction tingling/pain in hands   Time 4   Period Weeks   Status New   PT SHORT TERM GOAL #3   Title verbalize and demonstrate neutral seated posture and report compliance with postural correction at home   Time 4   Period Weeks   Status New           PT Long Term Goals - 06/13/15 1022    PT LONG TERM GOAL #1   Title be independent in advanced HEP   Time 8   Period Weeks   Status New   PT LONG TERM GOAL #2   Title reduce FOTO to < or = to 44% limitation   Time 8   Period Weeks   Status New   PT LONG TERM GOAL #3   Title report a 60% reduction in bil. hand tingling/pain   Time 8   Period Weeks   Status New   PT LONG TERM GOAL #4   Title demonstrate Rt grip strength to > or = to 50# to improve gripping objects when cooking.   Time 8   Period Weeks   Status New   PT LONG TERM GOAL #5   Title report a 60% reduction in neck stiffness and pain in the morning and with housework activity   Time 8   Period Weeks   Status New               Plan - 06/13/15 1053    Clinical Impression Statement Pt presents to PT with compliants of neck pain and tingling in bil. hands that began after a fall on wet floor 12/2014.  Pt demonstrates forward head and rounded shoulder posture, active trigger points in suboccipitals and UT (Rt>Lt) and hand weakness.  Pt will benefit from  skilled PT for postural education, cervical flexibility, manual for neck, traction and bil. hand strength.     Pt will  benefit from skilled therapeutic intervention in order to improve on the following deficits Pain;Postural dysfunction;Decreased activity tolerance;Decreased endurance;Decreased strength   Rehab Potential Good   PT Frequency 2x / week   PT Duration 8 weeks   PT Treatment/Interventions ADLs/Self Care Home Management;Electrical Stimulation;Cryotherapy;Moist Heat;Therapeutic exercise;Therapeutic activities;Functional mobility training;Ultrasound;Traction;Neuromuscular re-education;Patient/family education;Manual techniques;Taping;Dry needling;Passive range of motion   PT Next Visit Plan posture educaiton/strength, manual for neck and UT, traction, grip strength   Consulted and Agree with Plan of Care Patient         Problem List Patient Active Problem List   Diagnosis Date Noted  . DISEQUILIBRIUM 02/25/2007  . ALLERGIC RHINITIS 01/09/2007    Adetokunbo Mccadden, PT 06/13/2015, 11:07 AM  [facility] [address], [address] [place], [place], [address] Phone: [phone]   Fax:  [phone]  Name: Kristin Richardson MRN: [id number] Date of Birth: 1955-07-20

## 2015-06-16 ENCOUNTER — Ambulatory Visit: Payer: BLUE CROSS/BLUE SHIELD | Admitting: Physical Therapy

## 2015-06-16 DIAGNOSIS — R29898 Other symptoms and signs involving the musculoskeletal system: Secondary | ICD-10-CM

## 2015-06-16 DIAGNOSIS — M542 Cervicalgia: Secondary | ICD-10-CM

## 2015-06-16 DIAGNOSIS — M5010 Cervical disc disorder with radiculopathy, unspecified cervical region: Secondary | ICD-10-CM

## 2015-06-16 DIAGNOSIS — R293 Abnormal posture: Secondary | ICD-10-CM

## 2015-06-16 NOTE — Therapy (Signed)
[facility] [address], [address] [place], [place], [address] Phone: [phone]   Fax:  [phone]  Physical Therapy Treatment  Patient Details  Name: Christyanna Mckeon MRN: [id number] Date of Birth: [birth year] Referring Provider: Annell Greening, MD  Encounter Date: 06/16/2015      PT End of Session - 06/16/15 1437    Visit Number 2   Date for PT Re-Evaluation 08/08/15   Authorization Type [employer]   PT Start Time 1406   PT Stop Time 1450   PT Time Calculation (min) 44 min   Activity Tolerance Patient tolerated treatment well      No past medical history on file.  Past Surgical History  Procedure Laterality Date  . Breast reduction surgery      There were no vitals filed for this visit.  Visit Diagnosis:  Neck pain  Posture abnormality  Hand weakness  Cervical disc disorder with radiculopathy of cervical region      Subjective Assessment - 06/16/15 1408    Subjective States when she does the exercises her left leg will go numb afterwards for a while.  Unsure which of the (ROM) exercises caused this.  No pain now but has pins/needles in both hands.     Currently in Pain? No/denies   Pain Score 0-No pain   Pain Descriptors / Indicators Pins and needles   Pain Type Neuropathic pain   Pain Frequency Intermittent                         [facility] Adult PT Treatment/Exercise - 06/16/15 0001    Neck Exercises: Seated   Cervical Isometrics Flexion;Extension;Right lateral flexion;Left lateral flexion;Right rotation;Left rotation;5 secs;5 reps   Other Seated Exercise 1# wrist flexion and extension 10x each R/L   Other Seated Exercise finger instrinsic add/abd isometrics    Traction   Type of Traction Cervical   Min (lbs) 5   Max (lbs) 13   Hold Time 30   Rest Time 15   Time 15                PT Education - 06/16/15 1437    Education provided Yes   Education Details neck isometrics;  finger and wrist strengthening   Person(s) Educated Patient   Methods Explanation;Demonstration;Handout   Comprehension Verbalized understanding;Returned demonstration          PT Short Term Goals - 06/16/15 1443    PT SHORT TERM GOAL #1   Title be independent in initial HEP   Time 4   Period Weeks   Status On-going   PT SHORT TERM GOAL #2   Title report a 30% reduction tingling/pain in hands   Time 4   Period Weeks   Status On-going   PT SHORT TERM GOAL #3   Title verbalize and demonstrate neutral seated posture and report compliance with postural correction at home   Time 4   Period Weeks   Status On-going           PT Long Term Goals - 06/16/15 1545    PT LONG TERM GOAL #1   Title be independent in advanced HEP   Time 8   Period Weeks   Status On-going   PT LONG TERM GOAL #2   Title reduce FOTO to < or = to 44% limitation   Time 8   Period Weeks   Status On-going   PT LONG TERM GOAL #3  Title report a 60% reduction in bil. hand tingling/pain   Time 8   Period Weeks   Status On-going   PT LONG TERM GOAL #4   Title demonstrate Rt grip strength to > or = to 50# to improve gripping objects when cooking.   Time 8   Period Weeks   Status On-going   PT LONG TERM GOAL #5   Title report a 60% reduction in neck stiffness and pain in the morning and with housework activity   Time 8   Period Weeks   Status On-going               Plan - 06/16/15 1440    Clinical Impression Statement The patient reports left LE numbness with cervical AROM ex's.  Will hold and try cervical isometrics and distal strengthening.  Patient also notes difficulty lifting head up off the pillow when getting out of bed.  Trial of cervical traction for relief of UE symptoms.   No immediate change during treatment session.   Therapist  closely monitoring for pain and response of distal symptoms.     PT Next Visit Plan assess response to cervical traction, home ex program of isometrics  and peripheral strengthening         Problem List Patient Active Problem List   Diagnosis Date Noted  . DISEQUILIBRIUM 02/25/2007  . ALLERGIC RHINITIS 01/09/2007    Vivien Presto 06/16/2015, 3:46 PM  [facility] [address] [place], [place], [address] Phone: [phone]   Fax:  [phone]  Name: Brynnleigh Mcelwee MRN: [id number] Date of Birth: 19-May-1955    Lavinia Sharps, PT 06/16/2015 3:47 PM Phone: [phone] Fax: [phone]

## 2015-06-16 NOTE — Patient Instructions (Signed)
Isometric Rotation   Put left index finger on left temple. Gently try to turn head to right, pushing against finger. Hold ___5_ seconds. Repeat on the left. Push and release slowly. Repeat __5__ times. Do _2___ sessions per day.  http://gt2.exer.us/24   Copyright  VHI. All rights reserved.  Isometric Lateral Flexion   Put right index finger on right temple. Gently try to move right ear toward shoulder, pushing against finger. Hold __5__ seconds. Repeat on other side. Push and release slowly. Repeat __5__ times. Do ____2sessions per day.  http://gt2.exer.us/22   Copyright  VHI. All rights reserved.  Isometric Flexion   Put the tips of both index fingers lightly on forehead. Gently press into fingers as if looking toward ground. Resist for _5__ seconds. Press and release slowly. Repeat ___5_ times. Do _2___ sessions per day.  http://gt2.exer.us/18   Copyright  VHI. All rights reserved.  Isometric Extension   Put index fingers gently on back of head. Slowly try to look toward ceiling. Push head into fingers for _5__ seconds. Push and release slowly. Repeat __5__ times. Do __2__ sessions per day.  http://gt2.exer.us/20   Copyright  VHI. All rights reserved.     Finger exercises:  Rubber band around fingers pull apart fingers 10x                               Squeeze fingers with wash cloth between them 10x   Wrist exercises:  Can of veggies extend wrist 10x, flex wrist 10x; rotate like turning a doorknob 10x  [facility] [address] [place], [place] [address] Phone # [phone] Fax [phone]

## 2015-06-21 ENCOUNTER — Ambulatory Visit: Payer: BLUE CROSS/BLUE SHIELD

## 2015-06-21 DIAGNOSIS — M5010 Cervical disc disorder with radiculopathy, unspecified cervical region: Secondary | ICD-10-CM

## 2015-06-21 DIAGNOSIS — R29898 Other symptoms and signs involving the musculoskeletal system: Secondary | ICD-10-CM

## 2015-06-21 DIAGNOSIS — M542 Cervicalgia: Secondary | ICD-10-CM | POA: Diagnosis not present

## 2015-06-21 DIAGNOSIS — R293 Abnormal posture: Secondary | ICD-10-CM

## 2015-06-21 NOTE — Therapy (Signed)
[facility] [address] [place], [place], [address] Phone: [phone]   Fax:  [phone]  Physical Therapy Treatment  Patient Details  Name: Kristin Richardson MRN: [id number] Date of Birth: 09-May-1955 Referring Provider: Annell Greening, MD  Encounter Date: 06/21/2015      PT End of Session - 06/21/15 1257    Visit Number 3   Date for PT Re-Evaluation 08/08/15   PT Start Time 1233   PT Stop Time 1315   PT Time Calculation (min) 42 min   Activity Tolerance Patient tolerated treatment well   Behavior During Therapy [facility] for tasks assessed/performed      History reviewed. No pertinent past medical history.  Past Surgical History  Procedure Laterality Date  . Breast reduction surgery      There were no vitals filed for this visit.  Visit Diagnosis:  Neck pain  Posture abnormality  Hand weakness  Cervical disc disorder with radiculopathy of cervical region      Subjective Assessment - 06/21/15 1235    Subjective Pt reports that Lt LE tingling/numbness is intermittent now.  Pt is doing new HEP and is consistent.    Diagnostic tests x-ray: cervical spondylosis   Patient Stated Goals reduce pain in neck, reduced UE tingling/symptoms   Currently in Pain? Yes   Pain Score 6    Pain Location Neck   Pain Orientation Right;Left   Pain Type Chronic pain   Pain Radiating Towards headach   Pain Onset More than a month ago   Pain Frequency Intermittent   Aggravating Factors  housework, daily activity, combing hair   Pain Relieving Factors moving arms, stretching                         [facility] Adult PT Treatment/Exercise - 06/21/15 0001    Exercises   Exercises Shoulder   Neck Exercises: Machines for Strengthening   UBE (Upper Arm Bike) Level 1x 6 minutes (3/3)   Neck Exercises: Seated   Cervical Isometrics Flexion;Extension;Right lateral flexion;Left lateral flexion;Right rotation;Left  rotation;5 secs;5 reps   Other Seated Exercise 1# wrist flexion and extension 10x each R/L   Shoulder Exercises: Standing   Extension Strengthening;Both;20 reps;Theraband   Theraband Level (Shoulder Extension) Level 1 (Yellow)   Row Strengthening;Both;20 reps;Theraband   Theraband Level (Shoulder Row) Level 1 (Yellow)   Traction   Type of Traction Cervical   Min (lbs) 5   Max (lbs) 15   Hold Time 60   Rest Time 10   Time 15                  PT Short Term Goals - 06/21/15 1238    PT SHORT TERM GOAL #1   Title be independent in initial HEP   Time 4   Period Weeks   Status On-going   PT SHORT TERM GOAL #2   Title report a 30% reduction tingling/pain in hands   Time 4   Period Weeks   Status On-going   PT SHORT TERM GOAL #3   Title verbalize and demonstrate neutral seated posture and report compliance with postural correction at home   Status Achieved           PT Long Term Goals - 06/16/15 1545    PT LONG TERM GOAL #1   Title be independent in advanced HEP   Time 8   Period Weeks   Status On-going   PT  LONG TERM GOAL #2   Title reduce FOTO to < or = to 44% limitation   Time 8   Period Weeks   Status On-going   PT LONG TERM GOAL #3   Title report a 60% reduction in bil. hand tingling/pain   Time 8   Period Weeks   Status On-going   PT LONG TERM GOAL #4   Title demonstrate Rt grip strength to > or = to 50# to improve gripping objects when cooking.   Time 8   Period Weeks   Status On-going   PT LONG TERM GOAL #5   Title report a 60% reduction in neck stiffness and pain in the morning and with housework activity   Time 8   Period Weeks   Status On-going               Plan - 06/21/15 1246    Clinical Impression Statement Pt denies any signifcant change in UE radiculopathy since the start of care.  Pt is tolerating isometrics and wrist strength exercise well.  Pt with continued Lt foot numbness/tingling and she was advised to discuss these  symptoms with her MD as they are new.  Pt with improved postural awareness and is making corrections at home.  Pt will continue to benefit from skilled PT for traction, postural strength and flexibility exercises.    Pt will benefit from skilled therapeutic intervention in order to improve on the following deficits Pain;Postural dysfunction;Decreased activity tolerance;Decreased endurance;Decreased strength   Rehab Potential Good   PT Frequency 2x / week   PT Duration 8 weeks   PT Treatment/Interventions ADLs/Self Care Home Management;Electrical Stimulation;Cryotherapy;Moist Heat;Therapeutic exercise;Therapeutic activities;Functional mobility training;Ultrasound;Traction;Neuromuscular re-education;Patient/family education;Manual techniques;Taping;Dry needling;Passive range of motion   PT Next Visit Plan Continue traction. postural strength and gentle flexibility as tolerated.     Consulted and Agree with Plan of Care Patient        Problem List Patient Active Problem List   Diagnosis Date Noted  . DISEQUILIBRIUM 02/25/2007  . ALLERGIC RHINITIS 01/09/2007    Audley Hinojos, PT 06/21/2015, 12:58 PM  [facility] [address], [address] [place], [place], [address] Phone: [phone]   Fax:  [phone]  Name: Kristin Richardson MRN: [id number] Date of Birth: 08/05/1955

## 2015-06-24 ENCOUNTER — Ambulatory Visit: Payer: BLUE CROSS/BLUE SHIELD | Admitting: Physical Therapy

## 2015-06-24 DIAGNOSIS — R293 Abnormal posture: Secondary | ICD-10-CM

## 2015-06-24 DIAGNOSIS — R29898 Other symptoms and signs involving the musculoskeletal system: Secondary | ICD-10-CM

## 2015-06-24 DIAGNOSIS — M5010 Cervical disc disorder with radiculopathy, unspecified cervical region: Secondary | ICD-10-CM

## 2015-06-24 DIAGNOSIS — M542 Cervicalgia: Secondary | ICD-10-CM | POA: Diagnosis not present

## 2015-06-24 NOTE — Therapy (Signed)
[facility] [address] [place], [place], [address] Phone: [phone]   Fax:  [phone]  Physical Therapy Treatment  Patient Details  Name: Kristin Richardson MRN: [id number] Date of Birth: 1956/03/16 Referring Provider: Annell Greening, MD  Encounter Date: 06/24/2015      PT End of Session - 06/24/15 1008    Visit Number 4   Date for PT Re-Evaluation 08/08/15   Authorization Type BCBS   PT Start Time 0935   PT Stop Time 1020   PT Time Calculation (min) 45 min   Activity Tolerance Patient tolerated treatment well      No past medical history on file.  Past Surgical History  Procedure Laterality Date  . Breast reduction surgery      There were no vitals filed for this visit.  Visit Diagnosis:  Neck pain  Posture abnormality  Hand weakness  Cervical disc disorder with radiculopathy of cervical region      Subjective Assessment - 06/24/15 0935    Subjective My fingers are started to feel less numb.  Attributes that to ex, traction.  Mild neck pain and tips of fingers lightly numb.     Currently in Pain? Yes   Pain Score 2    Pain Location Neck   Aggravating Factors  comb hair; sleeping                         [facility] Adult PT Treatment/Exercise - 06/24/15 0001    Shoulder Exercises: Standing   Protraction Strengthening;Both;15 reps   Protraction Weight (lbs) wall push ups   Extension Strengthening;Both;20 reps;Theraband   Theraband Level (Shoulder Extension) Level 1 (Yellow)   Row Strengthening;Both;20 reps;Theraband   Theraband Level (Shoulder Row) Level 1 (Yellow)   Row Limitations upright   Other Standing Exercises triceps with band over door 15x   Other Standing Exercises D1/D2 extension 10x each R/L   Shoulder Exercises: ROM/Strengthening   UBE (Upper Arm Bike) 5 min 3/2 L 2   Other ROM/Strengthening Exercises 2# wrist flex and ext 15x each   Traction   Type of  Traction Cervical   Min (lbs) 8   Max (lbs) 15   Hold Time 60   Rest Time 10   Time 15                PT Education - 06/24/15 1008    Education provided Yes   Education Details yellow band UE ex   Person(s) Educated Patient   Methods Explanation;Demonstration;Handout   Comprehension Verbalized understanding;Returned demonstration          PT Short Term Goals - 06/24/15 1014    PT SHORT TERM GOAL #1   Title be independent in initial HEP   Time 4   Period Weeks   Status On-going   PT SHORT TERM GOAL #2   Title report a 30% reduction tingling/pain in hands   Time 4   Period Weeks   Status On-going   PT SHORT TERM GOAL #3   Title verbalize and demonstrate neutral seated posture and report compliance with postural correction at home   Status Achieved           PT Long Term Goals - 06/24/15 1014    PT LONG TERM GOAL #1   Title be independent in advanced HEP   Time 8   Period Weeks   Status On-going   PT LONG TERM GOAL #2  Title reduce FOTO to < or = to 44% limitation   Time 8   Period Weeks   Status On-going   PT LONG TERM GOAL #3   Title report a 60% reduction in bil. hand tingling/pain   Time 8   Period Weeks   Status On-going   PT LONG TERM GOAL #4   Title demonstrate Rt grip strength to > or = to 50# to improve gripping objects when cooking.   Time 8   Period Weeks   Status On-going   PT LONG TERM GOAL #5   Title report a 60% reduction in neck stiffness and pain in the morning and with housework activity   Time 8   Period Weeks   Status On-going               Plan - 06/24/15 1009    Clinical Impression Statement The patient reports an improvement in peripheral symptoms.  She has a mild increase in fingertip numbness with gripping the band but otherwise no exacerbation with moderate level exercise.  Verbal cues for postural alignment and close monitoring of symptom response.     PT Next Visit Plan Continue traction. postural strength  and gentle flexibility as tolerated, assess progress toward goals   PT Home Exercise Plan yellow band UE strength        Problem List Patient Active Problem List   Diagnosis Date Noted  . DISEQUILIBRIUM 02/25/2007  . ALLERGIC RHINITIS 01/09/2007    Vivien Presto 06/24/2015, 10:16 AM  [facility] [address] [place], [place], [address] Phone: [phone]   Fax:  [phone]  Name: Kristin Richardson MRN: [id number] Date of Birth: 06-09-1955    Lavinia Sharps, PT 06/24/2015 10:16 AM Phone: [phone] Fax: [phone]

## 2015-06-24 NOTE — Patient Instructions (Signed)
   [facility] [address], [address] [place], [place] [address] Phone # [phone] Fax [phone]  

## 2015-06-28 ENCOUNTER — Ambulatory Visit: Payer: BLUE CROSS/BLUE SHIELD | Admitting: Physical Therapy

## 2015-06-28 DIAGNOSIS — R29898 Other symptoms and signs involving the musculoskeletal system: Secondary | ICD-10-CM

## 2015-06-28 DIAGNOSIS — M542 Cervicalgia: Secondary | ICD-10-CM | POA: Diagnosis not present

## 2015-06-28 DIAGNOSIS — R293 Abnormal posture: Secondary | ICD-10-CM

## 2015-06-28 DIAGNOSIS — M5010 Cervical disc disorder with radiculopathy, unspecified cervical region: Secondary | ICD-10-CM

## 2015-06-28 NOTE — Therapy (Signed)
[facility] [address] [place], [place], [address] Phone: [phone]   Fax:  [phone]  Physical Therapy Treatment  Patient Details  Name: Kristin Richardson MRN: [id number] Date of Birth: 1956-03-25 Referring Provider: Rodell Perna, MD  Encounter Date: 06/28/2015      PT End of Session - 06/28/15 1432    Visit Number 5   Date for PT Re-Evaluation 08/08/15   Authorization Type BCBS   PT Start Time 1400   PT Stop Time 1443   PT Time Calculation (min) 43 min   Activity Tolerance Patient tolerated treatment well      No past medical history on file.  Past Surgical History  Procedure Laterality Date  . Breast reduction surgery      There were no vitals filed for this visit.  Visit Diagnosis:  Neck pain  Posture abnormality  Hand weakness  Cervical disc disorder with radiculopathy of cervical region      Subjective Assessment - 06/28/15 1400    Subjective I'm doing better.  Light tingling in fingers.  Neck pain only at night.  Generally sleeping well.     Currently in Pain? No/denies   Pain Score 0-No pain                         [facility] Adult PT Treatment/Exercise - 06/28/15 0001    Shoulder Exercises: Seated   Other Seated Exercises 3# finger digiflex 20x each   Other Seated Exercises velcro board for pronation/supination 15x R/L   Shoulder Exercises: Standing   Extension Strengthening;Both;20 reps;Theraband   Theraband Level (Shoulder Extension) Level 1 (Yellow)   Row Strengthening;Both;20 reps;Theraband   Theraband Level (Shoulder Row) Level 1 (Yellow)   Row Limitations upright 15x2   Other Standing Exercises triceps with band over door 15x   Other Standing Exercises D1/D2 extension 10x each R/L yellow band   Traction   Type of Traction Cervical   Min (lbs) 8   Max (lbs) 16   Hold Time 60   Rest Time 10   Time 15                  PT Short Term Goals -  06/28/15 1405    PT SHORT TERM GOAL #1   Title be independent in initial HEP   Status Achieved   PT SHORT TERM GOAL #2   Title report a 30% reduction tingling/pain in hands   Baseline 2/21   48% better   Status Achieved   PT SHORT TERM GOAL #3   Title verbalize and demonstrate neutral seated posture and report compliance with postural correction at home   Status Achieved           PT Long Term Goals - 06/28/15 1406    PT LONG TERM GOAL #1   Title be independent in advanced HEP   Time 8   Period Weeks   Status On-going   PT LONG TERM GOAL #2   Title reduce FOTO to < or = to 44% limitation   Time 8   Period Weeks   Status On-going   PT LONG TERM GOAL #3   Title report a 60% reduction in bil. hand tingling/pain   Time 8   Period Weeks   Status On-going   PT LONG TERM GOAL #4   Title demonstrate Rt grip strength to > or = to 50# to improve gripping objects when cooking.  Time 8   Period Weeks   Status On-going   PT LONG TERM GOAL #5   Title report a 60% reduction in neck stiffness and pain in the morning and with housework activity   Time 8   Period Weeks   Status On-going               Plan - 06/28/15 1420    Clinical Impression Statement All short term goals met.  Decreasing peripheral symptoms.  No increase in fingertip numbness with exercise.  Therapist closely monitoring throughout session.  Continue with treatment plan.     PT Next Visit Plan Continue traction. postural strength and gentle flexibility as tolerated, recheck grip strength        Problem List Patient Active Problem List   Diagnosis Date Noted  . DISEQUILIBRIUM 02/25/2007  . ALLERGIC RHINITIS 01/09/2007    Alvera Singh 06/28/2015, 2:33 PM  [facility] [address] [place], [place], [address] Phone: [phone]   Fax:  [phone]  Name: Kristin Richardson MRN: [id number] Date of Birth:  09/22/1955  [facility] [address] [place], [place] [address] Phone # [phone] Fax [phone]

## 2015-07-05 ENCOUNTER — Ambulatory Visit: Payer: BLUE CROSS/BLUE SHIELD | Admitting: Physical Therapy

## 2015-07-05 ENCOUNTER — Encounter: Payer: Self-pay | Admitting: Physical Therapy

## 2015-07-05 DIAGNOSIS — M542 Cervicalgia: Secondary | ICD-10-CM | POA: Diagnosis not present

## 2015-07-05 DIAGNOSIS — R293 Abnormal posture: Secondary | ICD-10-CM

## 2015-07-05 DIAGNOSIS — R29898 Other symptoms and signs involving the musculoskeletal system: Secondary | ICD-10-CM

## 2015-07-05 DIAGNOSIS — M5010 Cervical disc disorder with radiculopathy, unspecified cervical region: Secondary | ICD-10-CM

## 2015-07-05 NOTE — Therapy (Signed)
[facility] [address], [address] [place], [place], [address] Phone: [phone]   Fax:  [phone]  Physical Therapy Treatment  Patient Details  Name: Kristin Richardson MRN: [id number] Date of Birth: 06-Apr-1956 Referring Provider: Annell Greening, MD  Encounter Date: 07/05/2015      PT End of Session - 07/05/15 1506    Visit Number 6   Date for PT Re-Evaluation 08/08/15   Authorization Type BCBS   PT Start Time 1458  Pt arrived late   PT Stop Time 1532   PT Time Calculation (min) 34 min   Activity Tolerance Patient tolerated treatment well   Behavior During Therapy [facility] for tasks assessed/performed      History reviewed. No pertinent past medical history.  Past Surgical History  Procedure Laterality Date  . Breast reduction surgery      There were no vitals filed for this visit.  Visit Diagnosis:  Neck pain  Posture abnormality  Hand weakness  Cervical disc disorder with radiculopathy of cervical region      Subjective Assessment - 07/05/15 1505    Subjective Still light tingling in fingers of both hands. Neck pain is mainly during the night.   Pertinent History fall on wet floor (12/2014)   Diagnostic tests x-ray: cervical spondylosis   Patient Stated Goals reduce pain in neck, reduced UE tingling/symptoms   Currently in Pain? No/denies   Multiple Pain Sites No                         [facility] Adult PT Treatment/Exercise - 07/05/15 0001    Exercises   Exercises Shoulder   Neck Exercises: Seated   Cervical Isometrics Extension;20 reps;3 secs   Neck Exercises: Supine   Cervical Isometrics Extension;20 reps  while on foam roll   Cervical Isometrics Limitations --  pt needed pillow under head due to restricted thoracic mob   Shoulder Flexion Both;20 reps  with yellow t-band 2x10 each side   UE D2 Limitations --  with yellow t-band    Shoulder Exercises: Supine   Horizontal  ABduction Strengthening;20 reps;Theraband  yellow while on Foam roll   Other Supine Exercises Foam Roll x 3 min for elongation/decompression   Other Supine Exercises Foam roll D2 with yellow t-band , 2 x 10  with cervical retraction   Traction   Type of Traction Cervical   Min (lbs) 8   Max (lbs) 17   Hold Time 60   Rest Time 10   Time 15                  PT Short Term Goals - 06/28/15 1405    PT SHORT TERM GOAL #1   Title be independent in initial HEP   Status Achieved   PT SHORT TERM GOAL #2   Title report a 30% reduction tingling/pain in hands   Baseline 2/21   48% better   Status Achieved   PT SHORT TERM GOAL #3   Title verbalize and demonstrate neutral seated posture and report compliance with postural correction at home   Status Achieved           PT Long Term Goals - 07/05/15 1526    PT LONG TERM GOAL #1   Title be independent in advanced HEP   Time 8   Period Weeks   Status On-going   PT LONG TERM GOAL #2   Title reduce FOTO to < or =  to 44% limitation   Time 8   Period Weeks   Status On-going   PT LONG TERM GOAL #3   Title report a 60% reduction in bil. hand tingling/pain   Time 8   Period Weeks   Status On-going   PT LONG TERM GOAL #4   Title demonstrate Rt grip strength to > or = to 50# to improve gripping objects when cooking.   Time 8   Period Weeks   Status On-going   PT LONG TERM GOAL #5   Title report a 60% reduction in neck stiffness and pain in the morning and with housework activity   Time 8   Period Weeks   Status On-going               Plan - 07/05/15 1509    Clinical Impression Statement Pt able to perform exercises on Foam roll today, without increase in fingertip numbness. Therapist closely monitoring throughout session. Continue with strengthening postural muscles and cervical retration and thoracic flexibility   Pt will benefit from skilled therapeutic intervention in order to improve on the following deficits  Pain;Postural dysfunction;Decreased activity tolerance;Decreased endurance;Decreased strength   Rehab Potential Good   PT Frequency 2x / week   PT Duration 8 weeks   PT Treatment/Interventions ADLs/Self Care Home Management;Electrical Stimulation;Cryotherapy;Moist Heat;Therapeutic exercise;Therapeutic activities;Functional mobility training;Ultrasound;Traction;Neuromuscular re-education;Patient/family education;Manual techniques;Taping;Dry needling;Passive range of motion   PT Next Visit Plan Continue traction. postural strength and gentle flexibility as tolerated, recheck grip strength   PT Home Exercise Plan yellow band UE strength   Consulted and Agree with Plan of Care Patient        Problem List Patient Active Problem List   Diagnosis Date Noted  . DISEQUILIBRIUM 02/25/2007  . ALLERGIC RHINITIS 01/09/2007    NAUMANN-HOUEGNIFIO,Karol Skarzynski PTA 07/05/2015, 3:39 PM  [facility] [address] [place], [place], [address] Phone: [phone]   Fax:  [phone]  Name: Kristin Richardson MRN: [id number] Date of Birth: October 10, 1955

## 2015-07-07 ENCOUNTER — Ambulatory Visit: Payer: BLUE CROSS/BLUE SHIELD | Attending: Orthopaedic Surgery

## 2015-07-07 DIAGNOSIS — M542 Cervicalgia: Secondary | ICD-10-CM | POA: Diagnosis not present

## 2015-07-07 DIAGNOSIS — M5010 Cervical disc disorder with radiculopathy, unspecified cervical region: Secondary | ICD-10-CM | POA: Diagnosis present

## 2015-07-07 DIAGNOSIS — M6289 Other specified disorders of muscle: Secondary | ICD-10-CM | POA: Insufficient documentation

## 2015-07-07 DIAGNOSIS — R293 Abnormal posture: Secondary | ICD-10-CM | POA: Diagnosis present

## 2015-07-07 DIAGNOSIS — R29898 Other symptoms and signs involving the musculoskeletal system: Secondary | ICD-10-CM

## 2015-07-07 NOTE — Therapy (Addendum)
[facility] [address] [place], [place], [address] Phone: [phone]   Fax:  [phone]  Physical Therapy Treatment  Patient Details  Name: Kristin Richardson MRN: [id number] Date of Birth: 03/28/1956 Referring Provider: Annell Greening, MD  Encounter Date: 07/07/2015      PT End of Session - 07/07/15 1525    Visit Number 7   Date for PT Re-Evaluation 08/08/15   Authorization Type BCBS   PT Start Time 1450   PT Stop Time 1531   PT Time Calculation (min) 41 min   Activity Tolerance Patient tolerated treatment well   Behavior During Therapy [facility] for tasks assessed/performed      History reviewed. No pertinent past medical history.  Past Surgical History  Procedure Laterality Date  . Breast reduction surgery      There were no vitals filed for this visit.  Visit Diagnosis:  Neck pain  Posture abnormality  Hand weakness  Cervical disc disorder with radiculopathy of cervical region      Subjective Assessment - 07/07/15 1456    Subjective Pt reports that she didn't do exercises yesterday.  Overall 30% better since starting PT   Diagnostic tests x-ray: cervical spondylosis   Patient Stated Goals reduce pain in neck, reduced UE tingling/symptoms   Currently in Pain? Yes   Pain Score 6    Pain Location Neck   Pain Orientation Right;Left   Pain Descriptors / Indicators Pins and needles   Pain Type Chronic pain   Pain Onset More than a month ago   Pain Frequency Intermittent   Aggravating Factors  combing hair, sleeping   Pain Relieving Factors moving arms, stretching neck            [facility] PT Assessment - 07/07/15 0001    Strength   Right Hand Grip (lbs) 49   Left Hand Grip (lbs) 55                     [facility] Adult PT Treatment/Exercise - 07/07/15 0001    Neck Exercises: Machines for Strengthening   UBE (Upper Arm Bike) Level 1x 6 minutes (3/3)   Neck Exercises: Supine   Shoulder Flexion Both;20 reps  with yellow t-band 2x10 each side   Upper Extremity D2 20 reps   Theraband Level (UE D2) Level 1 (Yellow)   Shoulder Exercises: Supine   Horizontal ABduction Strengthening;20 reps;Theraband  yellow while on Foam roll   Other Supine Exercises Foam Roll x 3 min for elongation/decompression     Cervical Mechanical traction: Min 8#, Max 17# 60 seconds on/10 seconds off x 15 minutes             PT Short Term Goals - 06/28/15 1405    PT SHORT TERM GOAL #1   Title be independent in initial HEP   Status Achieved   PT SHORT TERM GOAL #2   Title report a 30% reduction tingling/pain in hands   Baseline 2/21   48% better   Status Achieved   PT SHORT TERM GOAL #3   Title verbalize and demonstrate neutral seated posture and report compliance with postural correction at home   Status Achieved           PT Long Term Goals - 07/07/15 1514    PT LONG TERM GOAL #4   Title demonstrate Rt grip strength to > or = to 50# to improve gripping objects when cooking.   Time 8  Period Weeks   Status On-going  49#               Plan - 07/07/15 1457    Clinical Impression Statement Pt with incresed pain today and rates neck pain as 6/10.  Pt reports 30% overall improvment in symptoms since the start of care. Grip strength is 49# today and improved from evaluation bilaterally.  Pt is performing exercises for strength at home with moderate compliance.  Pt with continued finger tingling related to physical actiivty.  Pt will continue to benefit from skilled PT for traction and strength exercises.     Pt will benefit from skilled therapeutic intervention in order to improve on the following deficits Pain;Postural dysfunction;Decreased activity tolerance;Decreased endurance;Decreased strength   Rehab Potential Good   PT Frequency 2x / week   PT Duration 8 weeks   PT Treatment/Interventions ADLs/Self Care Home Management;Electrical  Stimulation;Cryotherapy;Moist Heat;Therapeutic exercise;Therapeutic activities;Functional mobility training;Ultrasound;Traction;Neuromuscular re-education;Patient/family education;Manual techniques;Taping;Dry needling;Passive range of motion   PT Next Visit Plan Continue traction. postural strength and gentle flexibility as tolerated.   Consulted and Agree with Plan of Care Patient        Problem List Patient Active Problem List   Diagnosis Date Noted  . DISEQUILIBRIUM 02/25/2007  . ALLERGIC RHINITIS 01/09/2007    Buddy Loeffelholz, PT 07/07/2015, 3:26 PM  [facility] [address] [place], [place], [address] Phone: [phone]   Fax:  [phone]  Name: Kristin Richardson MRN: [id number] Date of Birth: 1955/10/29

## 2015-07-12 ENCOUNTER — Ambulatory Visit: Payer: BLUE CROSS/BLUE SHIELD

## 2015-07-12 DIAGNOSIS — M5010 Cervical disc disorder with radiculopathy, unspecified cervical region: Secondary | ICD-10-CM

## 2015-07-12 DIAGNOSIS — M542 Cervicalgia: Secondary | ICD-10-CM

## 2015-07-12 DIAGNOSIS — R29898 Other symptoms and signs involving the musculoskeletal system: Secondary | ICD-10-CM

## 2015-07-12 DIAGNOSIS — R293 Abnormal posture: Secondary | ICD-10-CM

## 2015-07-12 NOTE — Therapy (Signed)
[facility] [address], [address] [place], [place], [address] Phone: [phone]   Fax:  [phone]  Physical Therapy Treatment  Patient Details  Name: Kristin HoylesKaren Bonita Richardson MRN: [id number] Date of Birth: 11-03-1955 Referring Provider: Annell GreeningYates, Mark, MD  Encounter Date: 07/12/2015      PT End of Session - 07/12/15 1428    Visit Number 8   Date for PT Re-Evaluation 08/08/15   PT Start Time 1402   PT Stop Time 1447   PT Time Calculation (min) 45 min   Activity Tolerance Patient tolerated treatment well   Behavior [facility] Therapy [facility] for tasks assessed/performed      History reviewed. No pertinent past medical history.  Past Surgical History  Procedure Laterality Date  . Breast reduction surgery      There were no vitals filed for this visit.  Visit Diagnosis:  Neck pain  Posture abnormality  Hand weakness  Cervical disc disorder with radiculopathy of cervical region      Subjective Assessment - 07/12/15 1412    Subjective Pt reports 30% overall improvement since the start of care.  Pt will be ready to D/C to HEP next session.     Currently in Pain? Yes   Pain Score 7    Pain Location Neck   Pain Orientation Right;Left   Pain Descriptors / Indicators Pins and needles   Pain Type Chronic pain   Pain Onset More than a month ago   Pain Frequency Intermittent   Aggravating Factors  combing hair, sleeping   Pain Relieving Factors moving arms, stretching            [facility] PT Assessment - 07/12/15 0001    Observation/Other Assessments   Focus on Therapeutic Outcomes (FOTO)  49% limitation                     [facility] Adult PT Treatment/Exercise - 07/12/15 0001    Neck Exercises: Machines for Strengthening   UBE (Upper Arm Bike) Level 1x 6 minutes (3/3)   Neck Exercises: Supine   Shoulder Flexion Both;20 reps  with red t-band 2x10 each side   Upper Extremity D2 20 reps   Theraband Level (UE  D2) Level 2 (Red)   Shoulder Exercises: Supine   Horizontal ABduction Strengthening;20 reps;Theraband  red while on Foam roll   Other Supine Exercises Foam Roll x 3 min for elongation/decompression   Traction   Type of Traction Cervical   Min (lbs) 8   Max (lbs) 17   Hold Time 60   Rest Time 10   Time 15                  PT Short Term Goals - 06/28/15 1405    PT SHORT TERM GOAL #1   Title be independent in initial HEP   Status Achieved   PT SHORT TERM GOAL #2   Title report a 30% reduction tingling/pain in hands   Baseline 2/21   48% better   Status Achieved   PT SHORT TERM GOAL #3   Title verbalize and demonstrate neutral seated posture and report compliance with postural correction at home   Status Achieved           PT Long Term Goals - 07/07/15 1514    PT LONG TERM GOAL #4   Title demonstrate Rt grip strength to > or = to 50# to improve gripping objects when cooking.   Time 8  Period Weeks   Status On-going  49#               Plan - 07/12/15 1418    Clinical Impression Statement Pt reports 7/10 neck pain at times and reports 30% overall improvement in symptoms since the start of care. Grip strength is 49# and improved since the evaluation. FOTO is improved to 49% limitation today (67% at evaluation). Pt tolerated advancement to red band today with exercise and this was issued for HEP.  Pt with continued tingling in her fingers related to physical activity.  Pt will benefit from one more PT session for traction and to finalize HEP.     Pt will benefit from skilled therapeutic intervention in order to improve on the following deficits Pain;Postural dysfunction;Decreased activity tolerance;Decreased endurance;Decreased strength   Rehab Potential Good   PT Frequency 2x / week   PT Duration 8 weeks   PT Treatment/Interventions ADLs/Self Care Home Management;Electrical Stimulation;Cryotherapy;Moist Heat;Therapeutic exercise;Therapeutic  activities;Functional mobility training;Ultrasound;Traction;Neuromuscular re-education;Patient/family education;Manual techniques;Taping;Dry needling;Passive range of motion   PT Next Visit Plan 1 more session        Problem List Patient Active Problem List   Diagnosis Date Noted  . DISEQUILIBRIUM 02/25/2007  . ALLERGIC RHINITIS 01/09/2007    Jayliana Valencia, PT 07/12/2015, 2:29 PM  [facility] [address], [address] [place], [place], [address] Phone: [phone]   Fax:  [phone]  Name: Kristin Richardson MRN: [id number] Date of Birth: [birth year]

## 2015-07-14 ENCOUNTER — Ambulatory Visit: Payer: BLUE CROSS/BLUE SHIELD

## 2015-07-14 DIAGNOSIS — M5010 Cervical disc disorder with radiculopathy, unspecified cervical region: Secondary | ICD-10-CM

## 2015-07-14 DIAGNOSIS — M542 Cervicalgia: Secondary | ICD-10-CM | POA: Diagnosis not present

## 2015-07-14 DIAGNOSIS — R29898 Other symptoms and signs involving the musculoskeletal system: Secondary | ICD-10-CM

## 2015-07-14 DIAGNOSIS — R293 Abnormal posture: Secondary | ICD-10-CM

## 2015-07-14 NOTE — Therapy (Signed)
[facility] [address] [place], [place], [address] Phone: [phone]   Fax:  [phone]  Physical Therapy Treatment  Patient Details  Name: Kristin Richardson MRN: [id number] Date of Birth: 12-Jun-1955 Referring Provider: Rodell Perna, MD  Encounter Date: 07/14/2015      PT End of Session - 07/14/15 1421    Visit Number 8   PT Start Time 4431   PT Stop Time 5400   PT Time Calculation (min) 43 min   Activity Tolerance Patient tolerated treatment well   Behavior During Therapy [facility] for tasks assessed/performed      History reviewed. No pertinent past medical history.  Past Surgical History  Procedure Laterality Date  . Breast reduction surgery      There were no vitals filed for this visit.  Visit Diagnosis:  Neck pain  Posture abnormality  Hand weakness  Cervical disc disorder with radiculopathy of cervical region      Subjective Assessment - 07/14/15 1432    Subjective Pt reports 30% overall improvement since the start of care.  Pt ready for D/C   Currently in Pain? Yes   Pain Score 5    Pain Location Neck   Pain Orientation Right;Left   Pain Descriptors / Indicators Pins and needles   Pain Type Chronic pain   Pain Onset More than a month ago   Pain Frequency Intermittent   Aggravating Factors  combing hair, sleeping   Pain Relieving Factors moving arms, stretching            [facility] PT Assessment - 07/14/15 0001    Assessment   Medical Diagnosis cervical spondylosis   Onset Date/Surgical Date 12/23/14   Hand Dominance Right   Observation/Other Assessments   Focus on Therapeutic Outcomes (FOTO)  49% limitation   Strength   Overall Strength Deficits   Overall Strength Comments Lt shoulder and elbow 4+/5, Rt shoulder flexio,/abduction and elbow flexion 4+/5, Rt shoulder IR/ER 4/5    Strength Assessment Site Hand   Right/Left hand Right;Left   Right Hand Grip (lbs) 69   Left Hand  Grip (lbs) 74                     [facility] Adult PT Treatment/Exercise - 07/14/15 0001    Neck Exercises: Machines for Strengthening   UBE (Upper Arm Bike) Level 1x 6 minutes (3/3)   Neck Exercises: Supine   Shoulder Flexion Both;20 reps  with red t-band 2x10 each side   Upper Extremity D2 20 reps   Theraband Level (UE D2) Level 2 (Red)   Shoulder Exercises: Supine   Horizontal ABduction Strengthening;20 reps;Theraband  red while on Foam roll   Other Supine Exercises Foam Roll x 3 min for elongation/decompression   Shoulder Exercises: Seated   Flexion Strengthening;Both;20 reps;Weights   Flexion Weight (lbs) 1   Abduction Strengthening;Both;20 reps;Weights   ABduction Weight (lbs) 1   ABduction Limitations scaption 1# 2x10   Traction   Type of Traction Cervical   Min (lbs) 8   Max (lbs) 17   Hold Time 60   Rest Time 10   Time 15                PT Education - 07/14/15 1419    Education provided Yes   Education Details 3 way raises   Person(s) Educated Patient   Methods Explanation;Demonstration;Handout   Comprehension Verbalized understanding;Returned demonstration  PT Short Term Goals - 06/28/15 1405    PT SHORT TERM GOAL #1   Title be independent in initial HEP   Status Achieved   PT SHORT TERM GOAL #2   Title report a 30% reduction tingling/pain in hands   Baseline 2/21   48% better   Status Achieved   PT SHORT TERM GOAL #3   Title verbalize and demonstrate neutral seated posture and report compliance with postural correction at home   Status Achieved           PT Long Term Goals - 07/14/15 1405    PT LONG TERM GOAL #1   Title be independent in advanced HEP   Status Achieved   PT LONG TERM GOAL #2   Title reduce FOTO to < or = to 44% limitation   Status Partially Met  49% limitation   PT LONG TERM GOAL #3   Title report a 60% reduction in bil. hand tingling/pain   Status Partially Met  30% reduction in pain   PT LONG  TERM GOAL #4   Title demonstrate Rt grip strength to > or = to 50# to improve gripping objects when cooking.   Status Achieved   PT LONG TERM GOAL #5   Title report a 60% reduction in neck stiffness and pain in the morning and with housework activity   Status Partially Met  30% improvement               Plan - 07/14/15 1431    Clinical Impression Statement Pt reports 30% improvment ins symptoms since the start of care.  Grip strength has significantly improved in addition to shoulder strength.  Pt will be discharged to HEP for strength and flexiblity.     PT Next Visit Plan D/C PT to HEP   Consulted and Agree with Plan of Care Patient        Problem List Patient Active Problem List   Diagnosis Date Noted  . DISEQUILIBRIUM 02/25/2007  . ALLERGIC RHINITIS 01/09/2007  PHYSICAL THERAPY DISCHARGE SUMMARY  Visits from Start of Care: 9  Current functional level related to goals / functional outcomes: See above for current status.   Remaining deficits: Continued UE tingling.  Strength has improved.     Education / Equipment: HEP, posture education Plan: Patient agrees to discharge.  Patient goals were met. Patient is being discharged due to meeting the stated rehab goals.  ?????     Sigurd Sos, PT 07/14/2015 2:33 PM  [facility] [address] [place], [place], [address] Phone: [phone]   Fax:  [phone]  Name: Kristin Richardson MRN: [id number] Date of Birth: May 18, 1955

## 2015-07-14 NOTE — Patient Instructions (Signed)
Both shoulders at the same time.  Good posture!  SHOULDER: Flexion Unilateral (Weight)   Start with arm at side. Raise both arms forward and up to shoulder high  Keep elbows straight.  Use   lb weight.10  reps per set, 2-3___ sets per day.  Copyright  VHI. All rights reserved.  SHOULDER: Abduction (Weight)   Raise arm out and up. Keep elbow straight. Do not shrug shoulders. Use  # lb weight. _10__ reps per set, 2-_3__ sets per day.  Copyright  VHI. All rights reserved.  SHOULDER: Scaption (Weight)   Place arm at 45 angle to body. Raise arm up to shoulder level - shoulder keeping elbow straight.  Use   lb weight. _10__ reps per set, _2-3__ sets per day.    [facility] [address], [address] [place], [place] [address] Phone # [phone] Fax [phone]

## 2015-08-01 ENCOUNTER — Encounter: Payer: Self-pay | Admitting: *Deleted

## 2015-08-01 ENCOUNTER — Ambulatory Visit (INDEPENDENT_AMBULATORY_CARE_PROVIDER_SITE_OTHER): Payer: BLUE CROSS/BLUE SHIELD | Admitting: Family Medicine

## 2015-08-01 VITALS — BP 124/80 | HR 73 | Temp 98.1°F | Resp 20 | Ht 65.0 in | Wt 214.0 lb

## 2015-08-01 DIAGNOSIS — J069 Acute upper respiratory infection, unspecified: Secondary | ICD-10-CM

## 2015-08-01 MED ORDER — HYDROCOD POLST-CPM POLST ER 10-8 MG/5ML PO SUER: 5.0000 mL | Freq: Two times a day (BID) | ORAL | Status: AC | PRN

## 2015-08-01 NOTE — Patient Instructions (Addendum)
IF you received an x-ray today, you will receive an invoice from [facility]. Please contact [facility] at [phone] with questions or concerns regarding your invoice.   IF you received labwork today, you will receive an invoice from [facility]. Please contact [facility] at [phone] with questions or concerns regarding your invoice.   Our billing staff will not be able to assist you with questions regarding bills from these companies.  You will be contacted with the lab results as soon as they are available. The fastest way to get your results is to activate your My Chart account. Instructions are located on the last page of this paperwork. If you have not heard from us regarding the results in 2 weeks, please contact this office.   Upper Respiratory Infection, Adult Most upper respiratory infections (URIs) are a viral infection of the air passages leading to the lungs. A URI affects the nose, throat, and upper air passages. The most common type of URI is nasopharyngitis and is typically referred to as "the common cold." URIs run their course and usually go away on their own. Most of the time, a URI does not require medical attention, but sometimes a bacterial infection in the upper airways can follow a viral infection. This is called a secondary infection. Sinus and middle ear infections are common types of secondary upper respiratory infections. Bacterial pneumonia can also complicate a URI. A URI can worsen asthma and chronic obstructive pulmonary disease (COPD). Sometimes, these complications can require emergency medical care and may be life threatening.  CAUSES Almost all URIs are caused by viruses. A virus is a type of germ and can spread from one person to another.  RISKS FACTORS You may be at risk for a URI if:   You smoke.   You have chronic heart or lung disease.  You have a weakened defense (immune) system.   You are very young  or very old.   You have nasal allergies or asthma.  You work in crowded or poorly ventilated areas.  You work in health care facilities or schools. SIGNS AND SYMPTOMS  Symptoms typically develop 2-3 days after you come in contact with a cold virus. Most viral URIs last 7-10 days. However, viral URIs from the influenza virus (flu virus) can last 14-18 days and are typically more severe. Symptoms may include:   Runny or stuffy (congested) nose.   Sneezing.   Cough.   Sore throat.   Headache.   Fatigue.   Fever.   Loss of appetite.   Pain in your forehead, behind your eyes, and over your cheekbones (sinus pain).  Muscle aches.  DIAGNOSIS  Your health care provider may diagnose a URI by:  Physical exam.  Tests to check that your symptoms are not due to another condition such as:  Strep throat.  Sinusitis.  Pneumonia.  Asthma. TREATMENT  A URI goes away on its own with time. It cannot be cured with medicines, but medicines may be prescribed or recommended to relieve symptoms. Medicines may help:  Reduce your fever.  Reduce your cough.  Relieve nasal congestion. HOME CARE INSTRUCTIONS   Take medicines only as directed by your health care provider.   Gargle warm saltwater or take cough drops to comfort your throat as directed by your health care provider.  Use a warm mist humidifier or inhale steam from a shower to increase air moisture. This may make it easier to breathe.  Drink enough fluid to keep your   urine clear or pale yellow.   Eat soups and other clear broths and maintain good nutrition.   Rest as needed.   Return to work when your temperature has returned to normal or as your health care provider advises. You may need to stay home longer to avoid infecting others. You can also use a face mask and careful hand washing to prevent spread of the virus.  Increase the usage of your inhaler if you have asthma.   Do not use any tobacco  products, including cigarettes, chewing tobacco, or electronic cigarettes. If you need help quitting, ask your health care provider. PREVENTION  The best way to protect yourself from getting a cold is to practice good hygiene.   Avoid oral or hand contact with people with cold symptoms.   Wash your hands often if contact occurs.  There is no clear evidence that vitamin C, vitamin E, echinacea, or exercise reduces the chance of developing a cold. However, it is always recommended to get plenty of rest, exercise, and practice good nutrition.  SEEK MEDICAL CARE IF:   You are getting worse rather than better.   Your symptoms are not controlled by medicine.   You have chills.  You have worsening shortness of breath.  You have brown or red mucus.  You have yellow or brown nasal discharge.  You have pain in your face, especially when you bend forward.  You have a fever.  You have swollen neck glands.  You have pain while swallowing.  You have white areas in the back of your throat. SEEK IMMEDIATE MEDICAL CARE IF:   You have severe or persistent:  Headache.  Ear pain.  Sinus pain.  Chest pain.  You have chronic lung disease and any of the following:  Wheezing.  Prolonged cough.  Coughing up blood.  A change in your usual mucus.  You have a stiff neck.  You have changes in your:  Vision.  Hearing.  Thinking.  Mood. MAKE SURE YOU:   Understand these instructions.  Will watch your condition.  Will get help right away if you are not doing well or get worse.   This information is not intended to replace advice given to you by your health care provider. Make sure you discuss any questions you have with your health care provider.   Document Released: 10/17/2000 Document Revised: 09/07/2014 Document Reviewed: 07/29/2013 Elsevier Interactive Patient Education 2016 Elsevier Inc.  

## 2015-08-01 NOTE — Progress Notes (Signed)
Subjective:    Patient ID: Kristin Richardson, female    DOB: [birth year], 60 y.o.   MRN: [id number]  HPI  Began to have a severe sore throat 5d ago, laryngitis 4d ago, tried a lot of different gargles, throat spray, and cough syrups.  Now has a bad deep cough.  Tried her brothers tussionex.  She has been feeling weak and dizzy but pushing fluids and normal appetite.  Slept very well on the tussionex which was a good change for her.  No f/c.  Did have asthma as a child. No h/o smoking.  Does have a h/o arthritis for which she underwent PT. She did not get flu shot this yr.   No past medical history on file. Current Outpatient Prescriptions on File Prior to Visit  Medication Sig Dispense Refill  . Ascorbic Acid (VITAMIN C) 100 MG tablet Take 100 mg by mouth daily.    . cholecalciferol (VITAMIN D) 1000 units tablet Take 1,000 Units by mouth daily.    Marland Kitchen omega-3 acid ethyl esters (LOVAZA) 1 g capsule Take by mouth 2 (two) times daily.    . methocarbamol (ROBAXIN) 500 MG tablet Take 1 tablet (500 mg total) by mouth 2 (two) times daily as needed for muscle spasms. (Patient not taking: Reported on 06/13/2015) 20 tablet 0  . oxyCODONE (ROXICODONE) 5 MG immediate release tablet Take 0.5 tablets (2.5 mg total) by mouth every 4 (four) hours as needed for moderate pain or severe pain. (Patient not taking: Reported on 06/13/2015) 6 tablet 0   No current facility-administered medications on file prior to visit.   Allergies  Allergen Reactions  . Aspirin Other (See Comments)    Causes bleeding  . Ibuprofen Swelling  . Penicillins     REACTION: unspecified  . Tylenol [Acetaminophen] Swelling    Review of Systems  Constitutional: Positive for chills, diaphoresis, activity change, appetite change and fatigue. Negative for fever.  HENT: Positive for congestion, postnasal drip, rhinorrhea, sore throat, trouble swallowing and voice change. Negative for ear discharge, ear pain, mouth sores, nosebleeds, sinus  pressure and sneezing.   Eyes: Negative for pain.  Respiratory: Positive for cough. Negative for chest tightness, shortness of breath and wheezing.   Cardiovascular: Positive for chest pain (with cough only).  Gastrointestinal: Negative for vomiting, abdominal pain and diarrhea.  Genitourinary: Negative for dysuria, frequency and decreased urine volume.  Musculoskeletal: Positive for myalgias and arthralgias. Negative for gait problem and neck stiffness.  Neurological: Positive for weakness and light-headedness. Negative for syncope.  Hematological: Negative for adenopathy.  Psychiatric/Behavioral: Positive for sleep disturbance.       Objective:  BP 124/80 mmHg  Pulse 73  Temp(Src) 98.1 F (36.7 C) (Oral)  Resp 20  Ht  (1.651 m)  Wt 214 lb (97.07 kg)  BMI 35.61 kg/m2  SpO2 97%  Physical Exam  Constitutional: She is oriented to person, place, and time. She appears well-developed and well-nourished. No distress.  HENT:  Head: Normocephalic and atraumatic.  Right Ear: Tympanic membrane, external ear and ear canal normal.  Left Ear: Tympanic membrane, external ear and ear canal normal.  Nose: Nose normal. No mucosal edema or rhinorrhea.  Mouth/Throat: Uvula is midline, oropharynx is clear and moist and mucous membranes are normal. No oropharyngeal exudate.  Eyes: Conjunctivae are normal. Right eye exhibits no discharge. Left eye exhibits no discharge. No scleral icterus.  Neck: Normal range of motion. Neck supple.  Cardiovascular: Normal rate, regular rhythm, normal heart sounds and intact  distal pulses.   Pulmonary/Chest: Effort normal and breath sounds normal.  Lymphadenopathy:    She has no cervical adenopathy.  Neurological: She is alert and oriented to person, place, and time.  Skin: Skin is warm and dry. She is not diaphoretic. No erythema.  Psychiatric: She has a normal mood and affect. Her behavior is normal.       Assessment & Plan:   1. Acute upper respiratory  infection   Pt improving - I think she is recovering, cont to rest, hand hygiene, rtc if worsening Cough syrup per pt specific request  Meds ordered this encounter  Medications  . chlorpheniramine-HYDROcodone (TUSSIONEX PENNKINETIC ER) 10-8 MG/5ML SUER    Sig: Take 5 mLs by mouth every 12 (twelve) hours as needed.    Dispense:  120 mL    Refill:  0    Norberto SorensonEva Shaw, MD MPH

## 2015-09-12 ENCOUNTER — Ambulatory Visit (INDEPENDENT_AMBULATORY_CARE_PROVIDER_SITE_OTHER): Payer: BLUE CROSS/BLUE SHIELD | Admitting: Family Medicine

## 2015-09-12 VITALS — BP 138/86 | HR 66 | Temp 98.2°F | Resp 18 | Ht 65.0 in | Wt 214.0 lb

## 2015-09-12 DIAGNOSIS — J0191 Acute recurrent sinusitis, unspecified: Secondary | ICD-10-CM | POA: Diagnosis not present

## 2015-09-12 DIAGNOSIS — R062 Wheezing: Secondary | ICD-10-CM

## 2015-09-12 DIAGNOSIS — J219 Acute bronchiolitis, unspecified: Secondary | ICD-10-CM | POA: Diagnosis not present

## 2015-09-12 MED ORDER — HYDROCODONE-HOMATROPINE 5-1.5 MG/5ML PO SYRP: 5.0000 mL | ORAL_SOLUTION | ORAL | Status: AC | PRN

## 2015-09-12 MED ORDER — ALBUTEROL SULFATE HFA 108 (90 BASE) MCG/ACT IN AERS: 2.0000 | INHALATION_SPRAY | RESPIRATORY_TRACT | Status: AC | PRN

## 2015-09-12 MED ORDER — BENZONATATE 100 MG PO CAPS: 100.0000 mg | ORAL_CAPSULE | Freq: Three times a day (TID) | ORAL | Status: AC | PRN

## 2015-09-12 MED ORDER — DOXYCYCLINE HYCLATE 100 MG PO TABS: 100.0000 mg | ORAL_TABLET | Freq: Two times a day (BID) | ORAL | Status: AC

## 2015-09-12 NOTE — Addendum Note (Signed)
Addended by: Arlyss QueenPACK, Huntleigh Doolen D on: 09/12/2015 11:26 AM   Modules accepted: Kipp BroodSmartSet

## 2015-09-12 NOTE — Progress Notes (Signed)
Patient ID: Kristin Richardson, female    DOB: 1955-08-15  Age: 60 y.o. MRN: [id number]  Chief Complaint  Patient presents with  . URI    Subjective:   [age] year old lady who is in here 6 weeks ago with a respiratory tract infection. She got better, then over the last 2 weeks she has been worse again. She has head congestion. She has purulent drainage from her nose and is coughing up thick purulent mucus. She has been coughing a lot. She's had some wheezing. She does not smoke. 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 She has continued to work despite this.  Current allergies, medications, problem list, past/family and social histories reviewed.  Objective:  BP 138/92 mmHg  Pulse 66  Temp(Src) 98.2 F (36.8 C) (Oral)  Resp 18  Ht 5\' 5"  (1.651 m)  Wt 214 lb (97.07 kg)  BMI 35.61 kg/m2  SpO2 97%  No major distress. Has congestion. Her TMs are normal. Throat clear. Neck supple without major nodes. Chest has soft wheezing. No rhonchi or rales. Heart regular without murmur.  Assessment & Plan:   Assessment: 1. Acute recurrent sinusitis, unspecified location   2. Acute bronchiolitis due to unspecified organism   3. Wheezing       Plan: This seems to be a sinusitis/bronchitis infection. We'll go ahead and       Patient Instructions   Drink plenty of fluids and try to get enough rest  Take the antibiotic, doxycycline, 1 twice daily. Avoid taking it with dairy products because that decreases the medicines of torsion. You can sunburn easier on this medicine, so avoids getting too much sun while on it.  Take the benzonatate cough pills one or 2 pills 3 times daily as needed for daytime cough. This generally will not cause drowsiness.   Take the Hycodan cough syrup 1 teaspoon every 4-6 hours at nighttime for nighttime cough. This does tend to be sedating.  Use the albuterol inhaler 2 inhalations every 4-6 hours as needed for wheezing.  Take an  over-the-counter decongestant if needed for head congestion.  If not improving return      IF you received an x-ray today, you will receive an invoice from [facility]. Please contact [facility] at [phone] with questions or concerns regarding your invoice.   IF you received labwork today, you will receive an invoice from [facility]. Please contact [facility] at [phone] with questions or concerns regarding your invoice.   Our billing staff will not be able to assist you with questions regarding bills from these companies.  You will be contacted with the lab results as soon as they are available. The fastest way to get your results is to activate your My Chart account. Instructions are located on the last page of this paperwork. If you have not heard from us regarding the results in 2 weeks, please contact this office.         Return if symptoms worsen or fail to improve.   HOPPER,DAVID, MD 09/12/2015

## 2015-09-12 NOTE — Patient Instructions (Addendum)
Drink plenty of fluids and try to get enough rest  Take the antibiotic, doxycycline, 1 twice daily. Avoid taking it with dairy products because that decreases the medicines of torsion. You can sunburn easier on this medicine, so avoids getting too much sun while on it.  Take the benzonatate cough pills one or 2 pills 3 times daily as needed for daytime cough. This generally will not cause drowsiness.   Take the Hycodan cough syrup 1 teaspoon every 4-6 hours at nighttime for nighttime cough. This does tend to be sedating.  Use the albuterol inhaler 2 inhalations every 4-6 hours as needed for wheezing.  Take an over-the-counter decongestant if needed for head congestion.  If not improving return      IF you received an x-ray today, you will receive an invoice from [facility]. Please contact [facility] at [phone] with questions or concerns regarding your invoice.   IF you received labwork today, you will receive an invoice from [facility]. Please contact [facility] at [phone] with questions or concerns regarding your invoice.   Our billing staff will not be able to assist you with questions regarding bills from these companies.  You will be contacted with the lab results as soon as they are available. The fastest way to get your results is to activate your My Chart account. Instructions are located on the last page of this paperwork. If you have not heard from us regarding the results in 2 weeks, please contact this office.

## 2016-06-09 IMAGING — CR DG ORBITS FOR FOREIGN BODY
2 series · 2 of 2 positions shown · non-contrast
Comparison: None.

CLINICAL DATA: Metal working/exposure; clearance prior to MRI

EXAM:
ORBITS FOR FOREIGN BODY - 2 VIEW

[w orbit pa (1 of 2)]
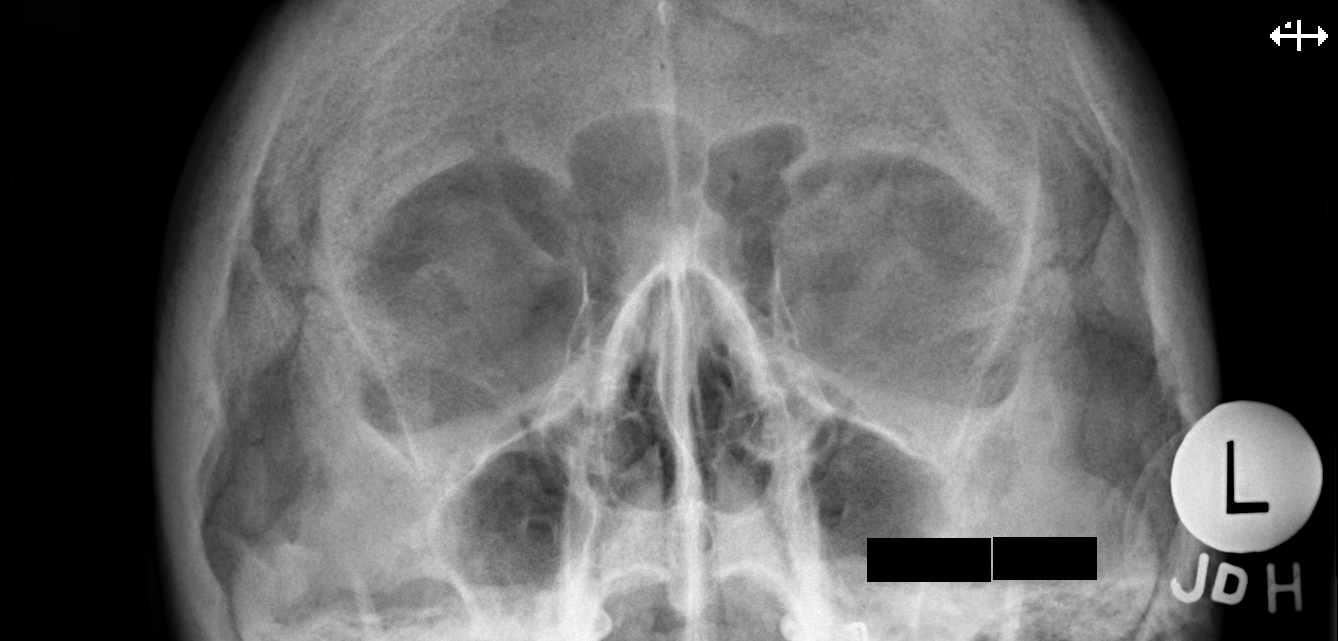

[w orbit pa (2 of 2)]
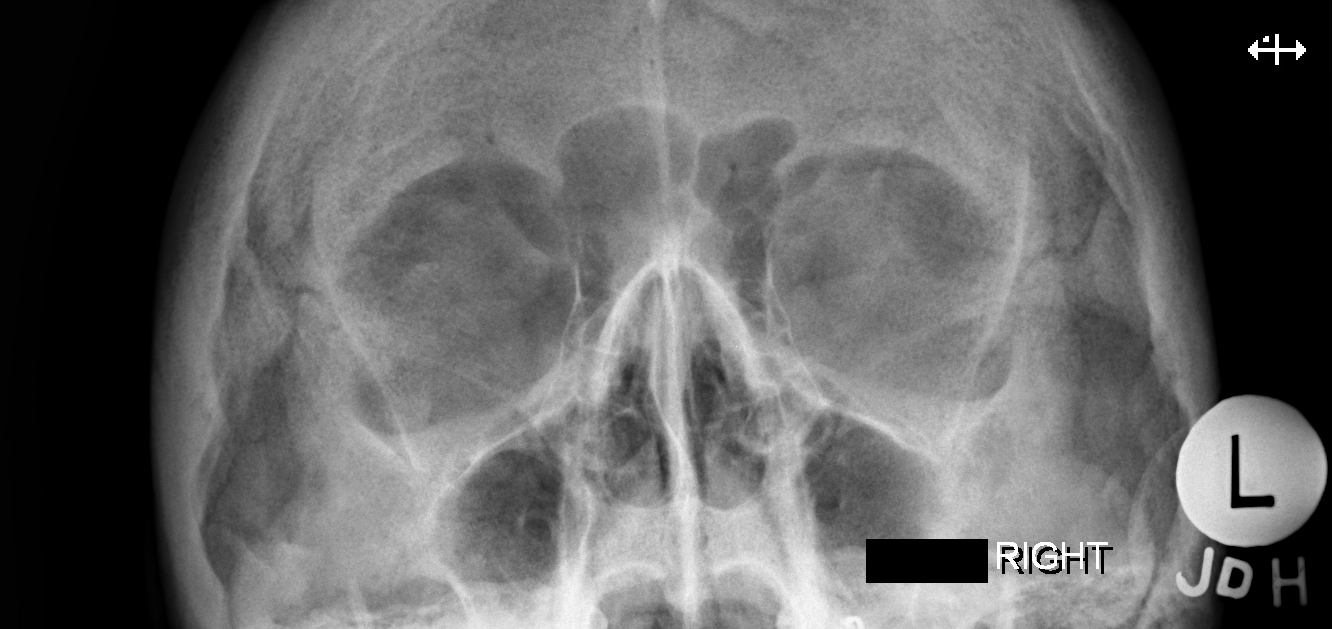

[2 of 2 positions shown; findings below may reference images not displayed]

FINDINGS: There is no evidence of metallic foreign body within the orbits. No
significant bone abnormality identified.
IMPRESSION: No evidence of metallic foreign body within the orbits.

## 2016-11-13 ENCOUNTER — Other Ambulatory Visit: Payer: Self-pay | Admitting: Family Medicine

## 2016-11-13 DIAGNOSIS — R062 Wheezing: Secondary | ICD-10-CM

## 2016-11-13 DIAGNOSIS — J219 Acute bronchiolitis, unspecified: Secondary | ICD-10-CM

## 2017-03-08 ENCOUNTER — Encounter (INDEPENDENT_AMBULATORY_CARE_PROVIDER_SITE_OTHER): Payer: Self-pay | Admitting: Orthopaedic Surgery

## 2017-03-08 ENCOUNTER — Ambulatory Visit (INDEPENDENT_AMBULATORY_CARE_PROVIDER_SITE_OTHER): Payer: BLUE CROSS/BLUE SHIELD | Admitting: Orthopaedic Surgery

## 2017-03-08 ENCOUNTER — Ambulatory Visit (INDEPENDENT_AMBULATORY_CARE_PROVIDER_SITE_OTHER): Payer: BLUE CROSS/BLUE SHIELD

## 2017-03-08 DIAGNOSIS — G8929 Other chronic pain: Secondary | ICD-10-CM

## 2017-03-08 DIAGNOSIS — M5412 Radiculopathy, cervical region: Secondary | ICD-10-CM

## 2017-03-08 DIAGNOSIS — M25511 Pain in right shoulder: Secondary | ICD-10-CM

## 2017-03-08 MED ORDER — PREDNISONE 10 MG (21) PO TBPK: ORAL_TABLET | ORAL | 0 refills | Status: AC

## 2017-03-08 MED ORDER — DICLOFENAC SODIUM 1 % TD GEL: 2.0000 g | Freq: Four times a day (QID) | TRANSDERMAL | 5 refills | Status: AC

## 2017-03-08 MED ORDER — DICLOFENAC SODIUM 75 MG PO TBEC: 75.0000 mg | DELAYED_RELEASE_TABLET | Freq: Two times a day (BID) | ORAL | 2 refills | Status: AC

## 2017-03-08 NOTE — Progress Notes (Signed)
Office Visit Note   Patient: Kristin Richardson           Date of Birth: 02-27-1956           MRN: [id number] Visit Date: 03/08/2017              Requested by: Gordy SaversKwiatkowski, Peter F, MD [address] [place], [place] [id number] PCP: Gordy SaversKwiatkowski, Peter F, MD   Assessment & Plan: Visit Diagnoses:  1. Chronic right shoulder pain   2. 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  Patient cannot afford physical therapy.  Prescription for prednisone taper, diclofenac, Voltaren gel.  Questions encouraged and answered.  Follow-up as needed.  Follow-Up Instructions: Return if symptoms worsen or fail to improve.   Orders:  Orders Placed This Encounter  Procedures  . XR Shoulder Right   Meds ordered this encounter  Medications  . predniSONE (STERAPRED UNI-PAK 21 TAB) 10 MG (21) TBPK tablet    Sig: Take as directed    Dispense:  21 tablet    Refill:  0  . diclofenac (VOLTAREN) 75 MG EC tablet    Sig: Take 1 tablet (75 mg total) by mouth 2 (two) times daily.    Dispense:  30 tablet    Refill:  2  . diclofenac sodium (VOLTAREN) 1 % GEL    Sig: Apply 2 g topically 4 (four) times daily.    Dispense:  1 Tube    Refill:  5      Procedures: No procedures performed   Clinical Data: No additional findings.   Subjective: Chief Complaint  Patient presents with  . Right Shoulder - Pain    Patient is a 61 year old female comes in with right shoulder pain that would radiate down into the right hand.  She states the pain originally started in her neck and then migrated to the posterior aspect of her shoulder and then has now migrated down to her arm.  She endorses mild numbness and tingling in her hands.  Pain is worse with reaching across her shoulder.  Denies any injuries.    Review of Systems  Constitutional: Negative.   HENT: Negative.   Eyes: Negative.   Respiratory: Negative.     Cardiovascular: Negative.   Endocrine: Negative.   Musculoskeletal: Negative.   Neurological: Negative.   Hematological: Negative.   Psychiatric/Behavioral: Negative.   All other systems reviewed and are negative.    Objective: Vital Signs: There were no vitals taken for this visit.  Physical Exam  Constitutional: She is oriented to person, place, and time. She appears well-developed and well-nourished.  HENT:  Head: Normocephalic and atraumatic.  Eyes: EOM are normal.  Neck: Neck supple.  Pulmonary/Chest: Effort normal.  Abdominal: Soft.  Neurological: She is alert and oriented to person, place, and time.  Skin: Skin is warm. Capillary refill takes less than 2 seconds.  Psychiatric: She has a normal mood and affect. Her behavior is normal. Judgment and thought content normal.  Nursing note and vitals reviewed.   Ortho Exam Right shoulder exam shows no significant impingement rotator cuff signs.  Positive cross adduction.  Equivocal Spurling's. Specialty Comments:  No specialty comments available.  Imaging: Xr Shoulder Right  Result Date: 03/08/2017 Advanced AC joint arthrosis.  Downward sloping acromion.  No significant shoulder joint arthrosis    PMFS History: Patient Active Problem List   Diagnosis Date Noted  . DISEQUILIBRIUM 02/25/2007  . ALLERGIC RHINITIS 01/09/2007  No past medical history on file.  No family history on file.  Past Surgical History:  Procedure Laterality Date  . BREAST REDUCTION SURGERY     Social History   Occupational History  . Not on file.   Social History Main Topics  . Smoking status: Never Smoker  . Smokeless tobacco: Never Used  . Alcohol use No  . Drug use: No  . Sexual activity: Not on file

## 2019-11-30 ENCOUNTER — Other Ambulatory Visit: Payer: Self-pay

## 2019-11-30 ENCOUNTER — Ambulatory Visit (INDEPENDENT_AMBULATORY_CARE_PROVIDER_SITE_OTHER): Payer: Medicare Other | Admitting: Otolaryngology

## 2020-07-07 DIAGNOSIS — R609 Edema, unspecified: Secondary | ICD-10-CM | POA: Diagnosis not present

## 2020-08-03 DIAGNOSIS — R609 Edema, unspecified: Secondary | ICD-10-CM | POA: Diagnosis not present

## 2020-08-18 DIAGNOSIS — R609 Edema, unspecified: Secondary | ICD-10-CM | POA: Diagnosis not present

## 2020-08-18 DIAGNOSIS — M25571 Pain in right ankle and joints of right foot: Secondary | ICD-10-CM | POA: Diagnosis not present

## 2020-11-01 DIAGNOSIS — S9001XA Contusion of right ankle, initial encounter: Secondary | ICD-10-CM | POA: Diagnosis not present

## 2021-08-04 DIAGNOSIS — Z1239 Encounter for other screening for malignant neoplasm of breast: Secondary | ICD-10-CM | POA: Diagnosis not present

## 2021-08-04 DIAGNOSIS — Z13228 Encounter for screening for other metabolic disorders: Secondary | ICD-10-CM | POA: Diagnosis not present

## 2021-08-04 DIAGNOSIS — Z0000 Encounter for general adult medical examination without abnormal findings: Secondary | ICD-10-CM | POA: Diagnosis not present

## 2021-08-08 ENCOUNTER — Other Ambulatory Visit: Payer: Self-pay | Admitting: Family Medicine

## 2021-08-08 DIAGNOSIS — Z1231 Encounter for screening mammogram for malignant neoplasm of breast: Secondary | ICD-10-CM

## 2021-08-11 DIAGNOSIS — E559 Vitamin D deficiency, unspecified: Secondary | ICD-10-CM | POA: Diagnosis not present

## 2021-08-11 DIAGNOSIS — Z13228 Encounter for screening for other metabolic disorders: Secondary | ICD-10-CM | POA: Diagnosis not present

## 2021-08-11 DIAGNOSIS — Z0000 Encounter for general adult medical examination without abnormal findings: Secondary | ICD-10-CM | POA: Diagnosis not present

## 2021-09-08 DIAGNOSIS — Z131 Encounter for screening for diabetes mellitus: Secondary | ICD-10-CM | POA: Diagnosis not present

## 2021-09-08 DIAGNOSIS — Z13220 Encounter for screening for lipoid disorders: Secondary | ICD-10-CM | POA: Diagnosis not present

## 2021-11-28 DIAGNOSIS — H2513 Age-related nuclear cataract, bilateral: Secondary | ICD-10-CM | POA: Diagnosis not present

## 2022-01-17 ENCOUNTER — Other Ambulatory Visit: Payer: Self-pay | Admitting: Gastroenterology

## 2022-01-17 DIAGNOSIS — R7989 Other specified abnormal findings of blood chemistry: Secondary | ICD-10-CM

## 2022-01-26 ENCOUNTER — Other Ambulatory Visit: Payer: Medicare Other

## 2022-02-02 ENCOUNTER — Other Ambulatory Visit: Payer: Medicare Other

## 2022-02-09 ENCOUNTER — Ambulatory Visit
Admission: RE | Admit: 2022-02-09 | Discharge: 2022-02-09 | Disposition: A | Discharge status: Home / Self Care | Payer: Medicare Other | Source: Ambulatory Visit | Attending: Gastroenterology | Admitting: Gastroenterology

## 2022-02-09 DIAGNOSIS — R7989 Other specified abnormal findings of blood chemistry: Secondary | ICD-10-CM

## 2022-02-23 ENCOUNTER — Other Ambulatory Visit: Payer: Self-pay | Admitting: Internal Medicine

## 2022-02-24 LAB — CBC
HCT: 38.7 % (ref 35.0–45.0)
Hemoglobin: 12.6 g/dL (ref 11.7–15.5)
MCH: 30.1 pg (ref 27.0–33.0)
MCHC: 32.6 g/dL (ref 32.0–36.0)
MCV: 92.6 fL (ref 80.0–100.0)
MPV: 12.4 fL (ref 7.5–12.5)
Platelets: 194 10*3/uL (ref 140–400)
RBC: 4.18 10*6/uL (ref 3.80–5.10)
RDW: 15.4 % — ABNORMAL HIGH (ref 11.0–15.0)
WBC: 5.2 10*3/uL (ref 3.8–10.8)

## 2022-02-24 LAB — COMPLETE METABOLIC PANEL WITH GFR
AG Ratio: 2.1 (calc) (ref 1.0–2.5)
ALT: 12 U/L (ref 6–29)
AST: 13 U/L (ref 10–35)
Albumin: 4.5 g/dL (ref 3.6–5.1)
Alkaline phosphatase (APISO): 68 U/L (ref 37–153)
BUN: 18 mg/dL (ref 7–25)
CO2: 27 mmol/L (ref 20–32)
Calcium: 9.8 mg/dL (ref 8.6–10.4)
Chloride: 107 mmol/L (ref 98–110)
Creat: 0.99 mg/dL (ref 0.50–1.05)
Globulin: 2.1 g/dL (calc) (ref 1.9–3.7)
Glucose, Bld: 88 mg/dL (ref 65–99)
Potassium: 4 mmol/L (ref 3.5–5.3)
Sodium: 144 mmol/L (ref 135–146)
Total Bilirubin: 0.5 mg/dL (ref 0.2–1.2)
Total Protein: 6.6 g/dL (ref 6.1–8.1)
eGFR: 62 mL/min/{1.73_m2} (ref >=60)

## 2022-02-24 LAB — VITAMIN D 25 HYDROXY (VIT D DEFICIENCY, FRACTURES): Vit D, 25-Hydroxy: 37 ng/mL (ref 30–100)

## 2022-02-24 LAB — FOLATE: Folate: 14.7 ng/mL

## 2022-02-24 LAB — VITAMIN B12: Vitamin B-12: 568 pg/mL (ref 200–1100)

## 2022-12-07 ENCOUNTER — Other Ambulatory Visit: Payer: Self-pay | Admitting: Internal Medicine

## 2022-12-08 LAB — VITAMIN D 25 HYDROXY (VIT D DEFICIENCY, FRACTURES): Vit D, 25-Hydroxy: 38 ng/mL (ref 30–100)

## 2022-12-08 LAB — URIC ACID: Uric Acid, Serum: 5.1 mg/dL (ref 2.5–7.0)

## 2024-03-24 ENCOUNTER — Other Ambulatory Visit: Payer: Self-pay | Admitting: Adult Health Nurse Practitioner

## 2024-03-24 DIAGNOSIS — Z1231 Encounter for screening mammogram for malignant neoplasm of breast: Secondary | ICD-10-CM

## 2024-04-10 ENCOUNTER — Ambulatory Visit
Admission: RE | Admit: 2024-04-10 | Discharge: 2024-04-10 | Disposition: A | Discharge status: Home / Self Care | Source: Ambulatory Visit | Attending: Adult Health Nurse Practitioner | Admitting: Adult Health Nurse Practitioner

## 2024-04-10 DIAGNOSIS — Z1231 Encounter for screening mammogram for malignant neoplasm of breast: Secondary | ICD-10-CM
